# Patient Record
Sex: Female | Born: 1975 | Race: Black or African American | Hispanic: No | Smoking: Never smoker
Health system: Southern US, Community
[De-identification: ages and names within clinical notes are randomized; demographics above are authoritative.]

## PROBLEM LIST (undated history)

## (undated) DIAGNOSIS — G8929 Other chronic pain: Secondary | ICD-10-CM

## (undated) DIAGNOSIS — M797 Fibromyalgia: Secondary | ICD-10-CM

## (undated) DIAGNOSIS — R87619 Unspecified abnormal cytological findings in specimens from cervix uteri: Secondary | ICD-10-CM

## (undated) DIAGNOSIS — D649 Anemia, unspecified: Secondary | ICD-10-CM

## (undated) DIAGNOSIS — R519 Headache, unspecified: Secondary | ICD-10-CM

## (undated) DIAGNOSIS — K649 Unspecified hemorrhoids: Secondary | ICD-10-CM

## (undated) DIAGNOSIS — L989 Disorder of the skin and subcutaneous tissue, unspecified: Secondary | ICD-10-CM

## (undated) DIAGNOSIS — IMO0002 Reserved for concepts with insufficient information to code with codable children: Secondary | ICD-10-CM

## (undated) DIAGNOSIS — I10 Essential (primary) hypertension: Secondary | ICD-10-CM

## (undated) DIAGNOSIS — R51 Headache: Secondary | ICD-10-CM

## (undated) HISTORY — DX: Unspecified hemorrhoids: K64.9

## (undated) HISTORY — PX: LEEP: SHX91

## (undated) HISTORY — PX: ABDOMINAL HYSTERECTOMY: SHX81

## (undated) HISTORY — DX: Unspecified abnormal cytological findings in specimens from cervix uteri: R87.619

## (undated) HISTORY — DX: Anemia, unspecified: D64.9

## (undated) HISTORY — PX: OTHER SURGICAL HISTORY: SHX169

## (undated) HISTORY — DX: Reserved for concepts with insufficient information to code with codable children: IMO0002

---

## 1998-11-15 ENCOUNTER — Emergency Department (HOSPITAL_COMMUNITY): Admission: EM | Admit: 1998-11-15 | Discharge: 1998-11-15 | Payer: Self-pay | Admitting: Emergency Medicine

## 1998-11-15 ENCOUNTER — Encounter: Payer: Self-pay | Admitting: Emergency Medicine

## 1999-01-28 ENCOUNTER — Inpatient Hospital Stay (HOSPITAL_COMMUNITY): Admission: AD | Admit: 1999-01-28 | Discharge: 1999-01-28 | Payer: Self-pay | Admitting: *Deleted

## 1999-01-31 ENCOUNTER — Emergency Department (HOSPITAL_COMMUNITY): Admission: EM | Admit: 1999-01-31 | Discharge: 1999-01-31 | Payer: Self-pay | Admitting: Emergency Medicine

## 1999-03-12 ENCOUNTER — Ambulatory Visit: Admission: RE | Admit: 1999-03-12 | Discharge: 1999-03-12 | Payer: Self-pay | Admitting: Gynecologic Oncology

## 1999-03-12 ENCOUNTER — Encounter (INDEPENDENT_AMBULATORY_CARE_PROVIDER_SITE_OTHER): Payer: Self-pay | Admitting: Specialist

## 1999-04-08 ENCOUNTER — Ambulatory Visit (HOSPITAL_COMMUNITY): Admission: RE | Admit: 1999-04-08 | Discharge: 1999-04-08 | Payer: Self-pay | Admitting: *Deleted

## 1999-04-23 ENCOUNTER — Inpatient Hospital Stay (HOSPITAL_COMMUNITY): Admission: AD | Admit: 1999-04-23 | Discharge: 1999-04-23 | Payer: Self-pay | Admitting: *Deleted

## 1999-05-01 ENCOUNTER — Ambulatory Visit (HOSPITAL_COMMUNITY): Admission: RE | Admit: 1999-05-01 | Discharge: 1999-05-01 | Payer: Self-pay | Admitting: *Deleted

## 1999-05-22 ENCOUNTER — Ambulatory Visit: Admission: RE | Admit: 1999-05-22 | Discharge: 1999-05-22 | Payer: Self-pay | Admitting: Gynecologic Oncology

## 1999-05-22 ENCOUNTER — Other Ambulatory Visit: Admission: RE | Admit: 1999-05-22 | Discharge: 1999-05-22 | Payer: Self-pay | Admitting: Gynecologic Oncology

## 1999-08-06 ENCOUNTER — Ambulatory Visit: Admission: RE | Admit: 1999-08-06 | Discharge: 1999-08-06 | Payer: Self-pay | Admitting: Gynecology

## 1999-08-06 ENCOUNTER — Other Ambulatory Visit: Admission: RE | Admit: 1999-08-06 | Discharge: 1999-08-06 | Payer: Self-pay | Admitting: Gynecology

## 1999-09-13 ENCOUNTER — Inpatient Hospital Stay (HOSPITAL_COMMUNITY): Admission: AD | Admit: 1999-09-13 | Discharge: 1999-09-15 | Payer: Self-pay | Admitting: *Deleted

## 1999-09-13 ENCOUNTER — Encounter (HOSPITAL_COMMUNITY): Admission: RE | Admit: 1999-09-13 | Discharge: 1999-09-16 | Payer: Self-pay | Admitting: *Deleted

## 1999-11-19 ENCOUNTER — Ambulatory Visit (HOSPITAL_COMMUNITY): Admission: RE | Admit: 1999-11-19 | Discharge: 1999-11-19 | Payer: Self-pay | Admitting: *Deleted

## 1999-12-10 ENCOUNTER — Ambulatory Visit: Admission: RE | Admit: 1999-12-10 | Discharge: 1999-12-10 | Payer: Self-pay | Admitting: Gynecologic Oncology

## 2000-10-19 ENCOUNTER — Emergency Department (HOSPITAL_COMMUNITY): Admission: EM | Admit: 2000-10-19 | Discharge: 2000-10-19 | Payer: Self-pay | Admitting: Emergency Medicine

## 2000-11-22 ENCOUNTER — Emergency Department (HOSPITAL_COMMUNITY): Admission: EM | Admit: 2000-11-22 | Discharge: 2000-11-22 | Payer: Self-pay | Admitting: Emergency Medicine

## 2000-12-08 ENCOUNTER — Ambulatory Visit: Admission: RE | Admit: 2000-12-08 | Discharge: 2000-12-08 | Payer: Self-pay | Admitting: Gynecologic Oncology

## 2000-12-08 ENCOUNTER — Other Ambulatory Visit: Admission: RE | Admit: 2000-12-08 | Discharge: 2000-12-08 | Payer: Self-pay | Admitting: Gynecologic Oncology

## 2001-01-05 ENCOUNTER — Encounter (INDEPENDENT_AMBULATORY_CARE_PROVIDER_SITE_OTHER): Payer: Self-pay

## 2001-01-05 ENCOUNTER — Ambulatory Visit (HOSPITAL_COMMUNITY): Admission: RE | Admit: 2001-01-05 | Discharge: 2001-01-05 | Payer: Self-pay | Admitting: Gynecology

## 2001-01-14 ENCOUNTER — Inpatient Hospital Stay (HOSPITAL_COMMUNITY): Admission: AD | Admit: 2001-01-14 | Discharge: 2001-01-14 | Payer: Self-pay | Admitting: *Deleted

## 2001-02-08 ENCOUNTER — Emergency Department (HOSPITAL_COMMUNITY): Admission: EM | Admit: 2001-02-08 | Discharge: 2001-02-08 | Payer: Self-pay | Admitting: Emergency Medicine

## 2001-09-07 ENCOUNTER — Ambulatory Visit: Admission: RE | Admit: 2001-09-07 | Discharge: 2001-09-07 | Payer: Self-pay | Admitting: Gynecology

## 2001-09-07 ENCOUNTER — Other Ambulatory Visit: Admission: RE | Admit: 2001-09-07 | Discharge: 2001-09-07 | Payer: Self-pay | Admitting: Gynecology

## 2002-04-18 ENCOUNTER — Other Ambulatory Visit: Admission: RE | Admit: 2002-04-18 | Discharge: 2002-04-18 | Payer: Self-pay | Admitting: Obstetrics & Gynecology

## 2002-10-12 ENCOUNTER — Emergency Department (HOSPITAL_COMMUNITY): Admission: EM | Admit: 2002-10-12 | Discharge: 2002-10-12 | Payer: Self-pay | Admitting: Emergency Medicine

## 2003-11-16 ENCOUNTER — Other Ambulatory Visit: Admission: RE | Admit: 2003-11-16 | Discharge: 2003-11-16 | Payer: Self-pay | Admitting: Family Medicine

## 2004-02-22 ENCOUNTER — Ambulatory Visit (HOSPITAL_COMMUNITY): Admission: RE | Admit: 2004-02-22 | Discharge: 2004-02-22 | Payer: Self-pay | Admitting: Obstetrics & Gynecology

## 2004-06-25 ENCOUNTER — Emergency Department (HOSPITAL_COMMUNITY): Admission: EM | Admit: 2004-06-25 | Discharge: 2004-06-25 | Payer: Self-pay | Admitting: Family Medicine

## 2005-05-25 ENCOUNTER — Emergency Department (HOSPITAL_COMMUNITY): Admission: EM | Admit: 2005-05-25 | Discharge: 2005-05-26 | Payer: Self-pay | Admitting: Emergency Medicine

## 2005-08-20 ENCOUNTER — Emergency Department (HOSPITAL_COMMUNITY): Admission: EM | Admit: 2005-08-20 | Discharge: 2005-08-20 | Payer: Self-pay | Admitting: Emergency Medicine

## 2007-01-20 ENCOUNTER — Emergency Department (HOSPITAL_COMMUNITY): Admission: EM | Admit: 2007-01-20 | Discharge: 2007-01-20 | Payer: Self-pay | Admitting: Family Medicine

## 2007-02-27 ENCOUNTER — Emergency Department (HOSPITAL_COMMUNITY): Admission: EM | Admit: 2007-02-27 | Discharge: 2007-02-27 | Payer: Self-pay | Admitting: Family Medicine

## 2007-08-26 HISTORY — PX: DIAGNOSTIC LAPAROSCOPY: SUR761

## 2008-01-13 ENCOUNTER — Encounter: Admission: RE | Admit: 2008-01-13 | Discharge: 2008-01-13 | Payer: Self-pay | Admitting: Internal Medicine

## 2008-03-06 ENCOUNTER — Encounter: Admission: RE | Admit: 2008-03-06 | Discharge: 2008-03-06 | Payer: Self-pay | Admitting: Internal Medicine

## 2008-03-16 ENCOUNTER — Emergency Department (HOSPITAL_COMMUNITY): Admission: EM | Admit: 2008-03-16 | Discharge: 2008-03-16 | Payer: Self-pay | Admitting: Family Medicine

## 2008-03-31 ENCOUNTER — Encounter: Payer: Self-pay | Admitting: Obstetrics & Gynecology

## 2008-03-31 ENCOUNTER — Ambulatory Visit (HOSPITAL_COMMUNITY): Admission: RE | Admit: 2008-03-31 | Discharge: 2008-03-31 | Payer: Self-pay | Admitting: Obstetrics & Gynecology

## 2008-07-22 ENCOUNTER — Inpatient Hospital Stay (HOSPITAL_COMMUNITY): Admission: AD | Admit: 2008-07-22 | Discharge: 2008-07-23 | Payer: Self-pay | Admitting: Obstetrics

## 2008-12-07 ENCOUNTER — Emergency Department (HOSPITAL_COMMUNITY): Admission: EM | Admit: 2008-12-07 | Discharge: 2008-12-07 | Payer: Self-pay | Admitting: Emergency Medicine

## 2008-12-09 ENCOUNTER — Emergency Department (HOSPITAL_COMMUNITY): Admission: EM | Admit: 2008-12-09 | Discharge: 2008-12-09 | Payer: Self-pay | Admitting: Emergency Medicine

## 2008-12-15 ENCOUNTER — Encounter: Admission: RE | Admit: 2008-12-15 | Discharge: 2008-12-15 | Payer: Self-pay | Admitting: Internal Medicine

## 2008-12-18 ENCOUNTER — Emergency Department (HOSPITAL_COMMUNITY): Admission: EM | Admit: 2008-12-18 | Discharge: 2008-12-18 | Payer: Self-pay | Admitting: Family Medicine

## 2010-01-28 ENCOUNTER — Encounter: Admission: RE | Admit: 2010-01-28 | Discharge: 2010-01-28 | Payer: Self-pay | Admitting: Internal Medicine

## 2010-06-01 ENCOUNTER — Emergency Department: Payer: Self-pay | Admitting: Emergency Medicine

## 2010-06-03 ENCOUNTER — Emergency Department (HOSPITAL_COMMUNITY): Admission: EM | Admit: 2010-06-03 | Discharge: 2010-06-03 | Payer: Self-pay | Admitting: Family Medicine

## 2010-08-21 ENCOUNTER — Emergency Department (HOSPITAL_COMMUNITY)
Admission: EM | Admit: 2010-08-21 | Discharge: 2010-08-21 | Payer: Self-pay | Source: Home / Self Care | Admitting: Emergency Medicine

## 2010-09-15 ENCOUNTER — Encounter: Payer: Self-pay | Admitting: Internal Medicine

## 2011-01-07 NOTE — Op Note (Signed)
NAMEROMONDA, PARKER              ACCOUNT NO.:  [id number]   MEDICAL RECORD NO.:  [id number]          PATIENT TYPE:  AMB   LOCATION:  SDC                           FACILITY:  [facility]   PHYSICIAN:  Roseanna Rainbow, M.D.DATE OF BIRTH:  [birth year]   DATE OF PROCEDURE:  03/31/2008  DATE OF DISCHARGE:                               OPERATIVE REPORT   PREOPERATIVE DIAGNOSIS:  Right-sided ovarian cyst.   POSTOPERATIVE DIAGNOSES:  1. Right-sided hemorrhagic ovarian cyst.  2. Pelvic adhesions.   SURGEONS:  1. Roseanna Rainbow, MD  2. Charles A. Clearance Coots, MD   ANESTHESIA:  General endotracheal.   COMPLICATIONS:  None.   IV FLUIDS AND URINE OUTPUT:  As per Anesthesiology.   PATHOLOGY:  Right ovarian cyst.   FINDINGS:  There was approximately 4 cm ovarian cyst on the right side.  During the dissection, the cyst was ruptured and a chocolate like  material was noted to be the contents of the cyst.  There were filmy  adhesions involving the right ovary to the parietal peritoneum of the  posterior cul-de-sac.  There were no other stigmata of endometriosis.   PROCEDURE:  The patient was taken to the operating room with an IV  running.  She was given general anesthesia, and placed in the dorsal  lithotomy position, and prepped and draped in the usual sterile fashion.  A bivalve speculum was then placed in the patient's vagina.  The  anterior lip of the cervix was grasped with a single-tooth tenaculum.  A  Hulka manipulator was then advanced into the uterus and secured to the  anterior lip of the cervix as a means to manipulate the uterus.  The  single-tooth tenaculum and speculum were then removed.  After a time-out  had been completed, an infraumbilical skin incision was then made with  the scalpel.  This was carried sharply down to the fascia.  The fascia  was tented up and entered sharply.  The parietal peritoneum was tented  up and entered as well.  The Hasson trocar and sleeve  were then advanced  into the abdomen.  The abdomen was then insufflated.  Bilateral lower  quadrant 5-mm ports were then placed in a direct visualization.  A 10-mm  suprapubic port trocar and sleeve were then advanced under direct  visualization.  A survey of the pelvic anatomy revealed the above  findings.  The above-noted adhesions were divided using EndoShears and  cautery.  The fallopian tube was then grabbed, grasped distally with  blunt traction.  The ovarian cortex was then cauterized and incised with  the EndoShears.  A plane was then developed between the cyst wall and  the ovarian cortex.  The cyst was then enucleated using both sharp and  blunt dissection.  Aqua dissection was also utilized using the Nezhat  irrigator.  During the dissection, the cyst wall was ruptured and a  chocolate type material was noted to be the contents of the cyst.  The  cyst was then placed in the anterior cul-de-sac.  The ovarian cortex was  then inspected for hemostasis.  Bleeding points  were then cauterized  using the Gyrus.  Adequate hemostasis was noted.  Indigo carmine was  given intravenously and there was noted to be no spill of the indigo  carmine in the pelvis.  The pelvis was then irrigated well.  The ovarian  cyst was then placed in an Endocatch bag and then removed from the  abdomen.  The intraabdominal pressure was then dropped to the 7 range  and again adequate hemostasis was noted.  All the instruments and trocar  sleeves were then removed from the abdomen.  The fascial incisions for  the suprapubic port and the infraumbilical port were then closed with 0  Vicryl sutures.  The skin incisions were then closed in subcuticular  fashion using 3-0 Monocryl and Dermabond was then applied.  The Hulka  manipulator was then removed with minimal bleeding noted from the  cervix.  At the close of the procedure, the instrument and pack counts  were said to be correct x2.  The patient was taken to  the PACU awake and  in stable condition.      Roseanna Rainbow, M.D.  Electronically Signed     LAJ/MEDQ  D:  03/31/2008  T:  04/01/2008  Job:  [id number]

## 2011-01-07 NOTE — H&P (Signed)
[name], [name]              ACCOUNT NO.:  [id number]   MEDICAL RECORD NO.:  [id number]          PATIENT TYPE:  AMB   LOCATION:  SDC                           FACILITY:  [facility]   PHYSICIAN:  Roseanna Rainbow, M.D.DATE OF BIRTH:  April 19, 1976   DATE OF ADMISSION:  DATE OF DISCHARGE:                              HISTORY & PHYSICAL   CHIEF COMPLAINT:  The patient is a 35 year old with a right-sided  ovarian cyst who presents for laparoscopic ovarian cystectomy.   HISTORY OF PRESENT ILLNESS:  The patient was recently diagnosed with an  ovarian cyst that has persisted on 2 radiologic studies.  The last study  was performed on March 06, 2008.  There was a complex cystic lesion  approximately 4-6 cm in diameter.  There was marginal interval increase  in the size of this cyst from the previous study.  She has associated  right-sided lower quadrant and pelvic pain.  There is also  menometrorrhagia.   ALLERGIES:  NO KNOWN DRUG ALLERGIES.   MEDICATIONS:  Please see the medication reconciliation form.   SOCIAL HISTORY:  She is single, unemployed, smokes occasionally.  Drinks  a moderate amount of alcoholic beverages.  Denies illicit drug use.   PAST GYNECOLOGICAL HISTORY:  There is a history of cervical dysplasia.  She is status post a LEEP in 2002.   FAMILY HISTORY:  Cervical cancer, myocardial infarction, CVA.   PAST MEDICAL HISTORY:  She denies.   REVIEW OF SYSTEMS:  GU:  Please see the above.   PHYSICAL EXAMINATION:  VITAL SIGNS:  Stable, afebrile.  GENERAL:  Mild distress.  LUNGS: Clear to auscultation bilaterally.  HEART:  Regular rate and rhythm.  ABDOMEN:  There is minimal right lower quadrant tenderness.  No rebound.  No guarding.  PELVIC:  Normal EGBUS.  Speculum exam, the vagina is clean.  The cervix  is without lesions.  Bimanual exam, the right adnexal area is tender.  There is fullness appreciated. The left adnexa is nonpalpable,  nontender.  The uterus is upper  limits of normal size, nontender.   ASSESSMENT:  Right adnexal mass/cystic lesion, symptomatic.   PLAN:  The planned procedure is operative laparoscopy with ovarian  cystectomy.  The risks, benefits, alternative forms of management were  reviewed with the patient and informed consent had been obtained.      Roseanna Rainbow, M.D.  Electronically Signed     LAJ/MEDQ  D:  03/31/2008  T:  03/31/2008  Job:  [id number]

## 2011-01-10 NOTE — Op Note (Signed)
[facility]  Patient:    Monique [name], Monique [name]                MRN: [id number] Proc. Date: 01/05/01 Adm. Date:  [id number] Disc. Date: [id number] Attending:  Antionette Char                           Operative Report  PREOPERATIVE DIAGNOSIS:  Severe dysplasia of the cervix.  POSTOPERATIVE DIAGNOSIS:  Severe dysplasia of the cervix.  OPERATION:  Loop electrosurgical excision procedure (LEEP) excision of the cervix with endocervical curettage.  SURGEON:  Daniel L. Clarke-Pearson, M.D.  ANESTHESIA:  General with orotracheal tube.  SURGICAL FINDINGS:  Examination under anesthesia revealed normal uterus and cervix.  The patient had previously undergone colposcopy and had findings on colposcopy which matched with nonstaining areas on Lugols staining.  DESCRIPTION OF PROCEDURE:  The patient was brought to the operating room, and after satisfactory attainment of general anesthesia, was placed in modified lithotomy position in candy cane stirrups.  The perineum and vagina were prepped, and the patient was draped.  Using a large loop, the exocervical lesion was excised after Lugol solution had been placed in order to outline the entire lesion.  Once this was accomplished, a margin was excised from the exocervix.  An Endocervical "top hat" was excised.  Endocervical curettage was then performed.  The base of the LEEP excision was cauterized with a ball cautery tip, and Munsell solution was applied.  The patient was awakened from anesthesia and taken to the recovery room in satisfactory condition.  Sponge, needle, and instrument counts were correct x 2. DD:  01/26/01 TD:  01/27/01 Job: [id number]

## 2011-01-10 NOTE — Consult Note (Signed)
[facility]  Patient:    Monique [name], Monique [name] Visit Number: [id number] MRN: [id number]          Service Type: GON Location: GYN Attending Physician:  Jeannette Corpus Dictated by:   Rande Brunt. Clarke-Pearson, M.D. Proc. Date: 09/07/01 Admit Date:  09/07/2001 Discharge Date: 09/07/2001   CC:         Leonette Most A. Clearance Coots, M.D.  Telford Nab, R.N.   Consultation Report  A 35 year old African-American female returns after prolonged time of lost to follow-up.  She underwent a LEEP excision of the cervix on Jan 05, 2001 and has failed to keep follow-up appointments.  She presents today for follow-up. The LEEP specimen showed that the patient had severe dysplasia and carcinoma in situ.  The patient reports she has regular cyclic menses.  Has not had any intramenstrual spotting or postcoital bleeding.  She denies any pelvic pain/pressure, GI or GU symptoms.  PHYSICAL EXAMINATION  VITAL SIGNS:  Weight 158 pounds.  ABDOMEN:  Soft, nontender.  No mass, organomegaly, ascites, or hernias are noted.  PELVIC:  EGBUS:  Normal.  Vagina is clean.  Cervix is healed nicely.  No lesions are noted.  Pap smears are obtained.  Bimanual reveals normal uterus. No adnexal masses are noted.  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 Pap smears are pending.  Provided these return normal, we will have the patient return to the care of the [facility] and would suggest she have a repeat Pap smear in approximately three to four months. Dictated by:   Rande Brunt. Clarke-Pearson, M.D. Attending Physician:  Jeannette Corpus DD:  09/15/01 TD:  09/16/01 Job: [id number]

## 2011-01-10 NOTE — Op Note (Signed)
[facility]  Patient:    Monique [name], Monique [name]                        MRN: [id number] Proc. Date: 11/19/99 Adm. Date:  [id number] Attending:  Deniece Ree                           Operative Report  PREOPERATIVE DIAGNOSIS:       Multiparity, desirous of permanent sterilization.  POSTOPERATIVE DIAGNOSIS:  OPERATION:                    Bilateral tubal cauterization with tubal resection.  SURGEON:                      Deniece Ree, M.D.  ASSISTANT:  ANESTHESIA:                   General anesthesia.  ESTIMATED BLOOD LOSS:         Less than 25 cc.  COMPLICATIONS:                None.  CONDITION:                    The patient tolerated the procedure well and returned to the recovery room in satisfactory condition.  DESCRIPTION OF PROCEDURE:     The patient was taken to the operating room and prepped and draped in the usual fashion for laparoscopic surgery.  The speculum was placed in the vagina following which the anterior lip of the cervix was then grasped with a Christella Hartigan tenaculum.  The Acorn uterine manipulating cannula was then put into place.  A subumbilical incision was then made following which the Veress needle was introduced through this incision and approximately 3 liters of carbon dioxide was then infused without any difficulty.  The laparoscopic trocar was placed through this incision following which the laparoscope was placed through its sleeve.  Visualization of the pelvic organs came into view.  The uterus, tubes, and ovaries all appeared to be within normal limits.  The right tube was then grasped 5 mm proximal to the right cornu and cauterized approximately 3 to 4 cm in length. This was done likewise on the left side.  In the cauterized area, the tubes were cut into two locations.  At this point, hemostasis remained present.  Sponge, needle, and instrument counts were correct x 2.  The carbon dioxide was then allowed to  escape from the abdominal cavity which it did so without any problems. The incision was then closed first with a deep interrupted stitch followed by closure of the skin.  The procedure was terminated.  The patient tolerated the procedure well and returned to the recovery room in satisfactory condition. The patient is to be discharged when fully alert.  She has been instructed on the possible complications and care following this type of surgery.  She has been told to return to my office in four weeks for follow-up evaluation or to call me prior to that time should any problems arise. DD:  11/19/99 TD:  11/19/99 Job: [id number]

## 2011-01-10 NOTE — H&P (Signed)
[facility]  Patient:    CARLYLE, ACHENBACH                        MRN: [id number] Adm. Date:  [id number] Attending:  Deniece Ree                         History and Physical  HISTORY:                      Patient is a 35 year old gravida 2, para 2 who is  being admitted for an outpatient bilateral tubal ligation.  Patient has expressed this for some time and all of her questions have been answered.  The possible complications were given to this patient who understands and accepts.  She has o medical problems that would contraindicate this type of surgery.  REVIEW OF HISTORY:            Noncontributory with the exception that patient has had two children.  PHYSICAL EXAMINATION:  GENERAL:                      Revealed a well-developed, well-nourished female n no acute distress.  HEENT:                        Within normal limits.  NECK:                         Supple.  BREASTS:                      Without masses, tenderness, or discharge.  LUNGS:                        Clear to percussion and auscultation.  HEART:                        Normal sinus rhythm without murmur, rub, or gallop.  ABDOMEN:                      Benign.  EXTREMITIES:                  Within normal limits.  NEUROLOGIC:                   Within normal limits.  PELVIC:                       Revealed external genitalia, BUS to be within normal limits.  The vagina is clear.  Cervix is nontender.  Uterus:  Normal size, shape, and consistency.  Adnexa are benign.  DIAGNOSIS:                    Multipara, desires for permanent sterilization.  PLAN:                         Laparoscopic bilateral tubular cauterization with  tubal resection. DD:  11/19/99 TD:  11/19/99 Job: [id number]

## 2011-01-10 NOTE — Consult Note (Signed)
[facility]  Patient:    Monique [name], Monique [name]                        MRN: [id number] Adm. Date:  [id number] Attending:  Ronita Hipps T CC:         Telford Nab, R.N.  Leane Call, R.N.  Bing Neighbors. Clearance Coots, M.D.   Consultation Report  Ms. 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  She was last seen in April 2001 and at that time had colposcopy with findings suggesting moderate dysplasia, but refused a biopsy and did not show up for a scheduled LEEP biopsy of the cervix.  Apparently, she has been fearful of discomfort related to the biopsy.  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  She had vaginal delivery on January 2001 and underwent postpartum tubal ligation.  At postpartum followup she had colposcopy suggesting persistent dysplasia, but did not show for a scheduled LEEP biopsy of the cervix.  She denies pelvic pain or postcoital/intramenstrual bleeding.  PAST MEDICAL HISTORY:  Prior sexually transmitted diseases with Trichomonas and PID.  No other major comorbidities.  PAST SURGICAL HISTORY:  Negative other than BTL and NSVD x 2.  MEDICATIONS:  None.  ALLERGIES:  No known drug allergies.  SOCIAL HISTORY:  Denies tobacco.  Intermittent social ethanol.  FAMILY HISTORY:  Significant for mother dying of surgical cancer following radio therapy.  REVIEW OF SYSTEMS:  Otherwise negative.  PHYSICAL EXAMINATION  VITAL SIGNS:  Stable and afebrile.  Weight 150 pounds.  GENERAL:  Patient is alert and oriented x 3 in no acute distress.  LUNGS:  Fields are clear.  BACK:  No back or CVA tenderness.  ABDOMEN:  Soft and benign without mass, ascites, or organomegaly.  EXTREMITIES:  No edema and full range of motion.  PELVIC:  External  genitalia and vault are clear without lesions.  The cervix has no visible lesions and is nontender to motion.  Bimanual examination reveals small anteflexed uterus with no palpable adnexal structures and normal cervical consistency.  There is no parametrial nodularity.  PROCEDURE:  Colposcopy of the upper vagina and cervix performed.  From 11-1 oclock there is a patch of white epithelium with punctation and a suggestion of possible mosaicism.  This extends into the canal.  The patient refused biopsy or ECC despite multiple attempts.  ASSESSMENT:  Prior high grade cervical dysplasia, untreated.  Colposcopy suggests persistent dysplasia.  Pap smear is obtained and will be communicated to the patient.  We had a lengthy discussion with the patient detailing our concerns that she might develop a potentially fatal, invasive malignancy of the cervix.  In no uncertain terms we recommended evaluation with biopsies and since she refused this, recommended performance of LEEP biopsy of the cervix as an outpatient.  This will be tentatively scheduled but if the patient will not follow through with this recommendation she will be sent a registered letter discharging her from our clinic. DD:  12/08/00 TD:  12/09/00 Job: [id number]

## 2011-02-13 ENCOUNTER — Inpatient Hospital Stay (HOSPITAL_COMMUNITY)
Admission: AD | Admit: 2011-02-13 | Discharge: 2011-02-13 | Disposition: A | Discharge status: Home / Self Care | Payer: Medicaid Other | Source: Ambulatory Visit | Attending: Obstetrics & Gynecology | Admitting: Obstetrics & Gynecology

## 2011-02-13 DIAGNOSIS — N644 Mastodynia: Secondary | ICD-10-CM | POA: Insufficient documentation

## 2011-02-13 DIAGNOSIS — G43909 Migraine, unspecified, not intractable, without status migrainosus: Secondary | ICD-10-CM | POA: Insufficient documentation

## 2011-02-13 DIAGNOSIS — I10 Essential (primary) hypertension: Secondary | ICD-10-CM

## 2011-02-13 LAB — URINALYSIS, ROUTINE W REFLEX MICROSCOPIC
Bilirubin Urine: NEGATIVE
Ketones, ur: NEGATIVE mg/dL
Leukocytes, UA: NEGATIVE
Specific Gravity, Urine: 1.02 (ref 1.005–1.030)
pH: 6.5 (ref 5.0–8.0)

## 2011-02-13 LAB — POCT PREGNANCY, URINE: Preg Test, Ur: NEGATIVE

## 2011-03-01 ENCOUNTER — Encounter (HOSPITAL_COMMUNITY): Payer: Self-pay | Admitting: Obstetrics and Gynecology

## 2011-03-01 ENCOUNTER — Inpatient Hospital Stay (HOSPITAL_COMMUNITY): Payer: Medicaid Other | Admitting: Family Medicine

## 2011-03-01 ENCOUNTER — Inpatient Hospital Stay (HOSPITAL_COMMUNITY)
Admission: AD | Admit: 2011-03-01 | Discharge: 2011-03-01 | Disposition: A | Discharge status: Home / Self Care | Payer: Medicaid Other | Source: Ambulatory Visit | Attending: Family Medicine | Admitting: Family Medicine

## 2011-03-01 DIAGNOSIS — N949 Unspecified condition associated with female genital organs and menstrual cycle: Secondary | ICD-10-CM | POA: Insufficient documentation

## 2011-03-01 HISTORY — DX: Disorder of the skin and subcutaneous tissue, unspecified: L98.9

## 2011-03-01 HISTORY — DX: Headache: R51

## 2011-03-01 HISTORY — DX: Other chronic pain: G89.29

## 2011-03-01 HISTORY — DX: Headache, unspecified: R51.9

## 2011-03-01 LAB — URINALYSIS, ROUTINE W REFLEX MICROSCOPIC
Bilirubin Urine: NEGATIVE
Specific Gravity, Urine: >1.03 — ABNORMAL HIGH (ref 1.005–1.030)
pH: 6 (ref 5.0–8.0)

## 2011-03-01 LAB — URINE MICROSCOPIC-ADD ON

## 2011-03-01 LAB — POCT PREGNANCY, URINE: Preg Test, Ur: NEGATIVE

## 2011-03-01 NOTE — Progress Notes (Signed)
"  Yellow d/c now that has a nasty odor like at the end of your cycle kind of smell.  It started 2 days ago, but it wasn't yellow.  It was just heavier.  LMP was 02/18/11 lasted 2 days, it's normally 4-5 days."

## 2011-03-01 NOTE — Progress Notes (Signed)
Yellow vaginal discharge since Wednesday night with foul odor, LMP 02/23/2011

## 2011-03-06 ENCOUNTER — Ambulatory Visit: Payer: Medicaid Other | Admitting: Obstetrics and Gynecology

## 2011-04-11 ENCOUNTER — Inpatient Hospital Stay (HOSPITAL_COMMUNITY)
Admission: AD | Admit: 2011-04-11 | Discharge: 2011-04-11 | Disposition: A | Discharge status: Home / Self Care | Payer: Medicaid Other | Source: Ambulatory Visit | Attending: Obstetrics & Gynecology | Admitting: Obstetrics & Gynecology

## 2011-04-11 ENCOUNTER — Encounter (HOSPITAL_COMMUNITY): Payer: Self-pay

## 2011-04-11 DIAGNOSIS — B3731 Acute candidiasis of vulva and vagina: Secondary | ICD-10-CM | POA: Insufficient documentation

## 2011-04-11 DIAGNOSIS — B373 Candidiasis of vulva and vagina: Secondary | ICD-10-CM | POA: Insufficient documentation

## 2011-04-11 DIAGNOSIS — N949 Unspecified condition associated with female genital organs and menstrual cycle: Secondary | ICD-10-CM | POA: Insufficient documentation

## 2011-04-11 HISTORY — DX: Essential (primary) hypertension: I10

## 2011-04-11 LAB — WET PREP, GENITAL: Trich, Wet Prep: NONE SEEN

## 2011-04-11 MED ORDER — FLUCONAZOLE 150 MG PO TABS: 150.0000 mg | ORAL_TABLET | Freq: Once | ORAL | Status: AC

## 2011-04-11 NOTE — Progress Notes (Signed)
Pt states she has had a yellow vaginal discharge for 2 days that is itching.

## 2011-04-11 NOTE — ED Provider Notes (Addendum)
History   Pt presents today c/o vag dc and irritation for the past 2 days. She states she had trich several months ago and recently had intercourse with the same man. She denies abd pain, fever, vag bleeding, or any other sx at this time.  Chief Complaint  Patient presents with  . Vaginal Discharge   HPI  OB History    Grav Para Term Preterm Abortions TAB SAB Ect Mult Living   2 2 2  0 0 0 0 0 0 2      Past Medical History  Diagnosis Date  . Chronic headaches   . Skin abnormalities     unnknown skin dx, extremely dry skin  . Alopecia   . Hypertension     Past Surgical History  Procedure Date  . Leep 2001 or 2002  . Unknown     "some surgery to remove fibroid cyst that started with an 'E' "    Family History  Problem Relation Age of Onset  . Cancer Mother   . Cancer Brother   . Diabetes Mother   . Diabetes Father   . Diabetes Sister   . Diabetes Brother     History  Substance Use Topics  . Smoking status: Never Smoker   . Smokeless tobacco: Not on file  . Alcohol Use: 0.5 oz/week    1 drink(s) per week     socially    Allergies: No Known Allergies  Prescriptions prior to admission  Medication Sig Dispense Refill  . Eszopiclone (LUNESTA PO) Take by mouth at bedtime as needed.        . Topiramate (TOPAMAX PO) Take by mouth every other day. For migraine prevention       . TRIAMCINOLONE ACETONIDE EX Apply topically 2 (two) times daily.        Marland Kitchen VERAPAMIL HCL PO Take by mouth daily.          Review of Systems  Constitutional: Negative for fever and chills.  Cardiovascular: Negative for chest pain.  Gastrointestinal: Negative for nausea, vomiting, abdominal pain and diarrhea.  Genitourinary: Negative for dysuria, urgency, frequency and hematuria.  Neurological: Negative for dizziness and headaches.  Psychiatric/Behavioral: Negative for depression and suicidal ideas.   Physical Exam   Blood pressure 130/90, pulse 80, temperature 98.5 F (36.9 C),  temperature source Oral, resp. rate 16, height 5\' 5"  (1.651 m), weight 188 lb 12.8 oz (85.639 kg), last menstrual period 03/18/2011, SpO2 98.00%.  Physical Exam  Constitutional: She is oriented to person, place, and time. She appears well-developed and well-nourished. No distress.  HENT:  Head: Normocephalic and atraumatic.  Eyes: EOM are normal. Pupils are equal, round, and reactive to light.  GI: Soft. She exhibits no distension. There is no tenderness. There is no rebound and no guarding.  Genitourinary: No bleeding around the vagina. Vaginal discharge found.       Green, frothy vag dc present.  Neurological: She is alert and oriented to person, place, and time.  Skin: Skin is warm and dry. She is not diaphoretic.  Psychiatric: She has a normal mood and affect. Her behavior is normal. Judgment and thought content normal.    MAU Course  Procedures  Wet prep and GC/Chlamydia cultures obtained.  Results for orders placed during the hospital encounter of 04/11/11 (from the past 24 hour(s))  WET PREP, GENITAL     Status: Abnormal   Collection Time   04/11/11  7:56 AM      Component Value Range  Yeast, Wet Prep FEW (*) NONE SEEN    Trich, Wet Prep NONE SEEN  NONE SEEN    Clue Cells, Wet Prep NONE SEEN  NONE SEEN    WBC, Wet Prep HPF POC TOO NUMEROUS TO COUNT (*) NONE SEEN     Assessment and Plan  Yeast: discussed with pt at length. Will give Rx for diflucan. Discussed diet, activity, risks, and precautions. Discussed safe sex practices.  Clinton Gallant. Marlean Mortell III, DrHSc, MPAS, PA-C  04/11/2011, 8:01 AM

## 2011-05-23 LAB — CBC
MCHC: 34
MCV: 91.5
RBC: 3.94

## 2011-05-28 ENCOUNTER — Encounter: Payer: Medicaid Other | Admitting: Obstetrics & Gynecology

## 2011-05-28 ENCOUNTER — Ambulatory Visit (INDEPENDENT_AMBULATORY_CARE_PROVIDER_SITE_OTHER): Payer: Medicaid Other | Admitting: Obstetrics and Gynecology

## 2011-05-28 ENCOUNTER — Other Ambulatory Visit (HOSPITAL_COMMUNITY)
Admission: RE | Admit: 2011-05-28 | Discharge: 2011-05-28 | Disposition: A | Discharge status: Home / Self Care | Payer: Medicaid Other | Source: Ambulatory Visit | Attending: Obstetrics and Gynecology | Admitting: Obstetrics and Gynecology

## 2011-05-28 ENCOUNTER — Encounter: Payer: Self-pay | Admitting: Obstetrics and Gynecology

## 2011-05-28 VITALS — BP 130/88 | HR 101 | Temp 97.4°F | Ht 64.0 in | Wt 185.3 lb

## 2011-05-28 DIAGNOSIS — Z01419 Encounter for gynecological examination (general) (routine) without abnormal findings: Secondary | ICD-10-CM

## 2011-05-28 DIAGNOSIS — Z23 Encounter for immunization: Secondary | ICD-10-CM

## 2011-05-28 LAB — POCT PREGNANCY, URINE: Preg Test, Ur: NEGATIVE

## 2011-05-28 LAB — CBC
HCT: 33.8 — ABNORMAL LOW
MCV: 89.9
Platelets: 170
RDW: 13.7

## 2011-05-28 MED ORDER — INFLUENZA VIRUS VACC SPLIT PF IM SUSP
0.5000 mL | Freq: Once | INTRAMUSCULAR | Status: AC
  Administered 2011-05-28: 0.5 mL via INTRAMUSCULAR

## 2011-05-28 NOTE — Progress Notes (Signed)
This patient is a 35 year old African American female gravida 2 para 2002. She went underwent a LEEP conization for dysplasia of the cervix in 2002. Her Pap smears have been annual and all normal since that time. She is in today for routine well woman GYN exam. She has an internist of takes care of medical problems.  Significant family history is her mother had cervical cancer and her father heart disease.  She's currently on prophylactic medication for migraine was seems to been quite successful. She's now being worked up for fibromyalgia.  Examination: Thyroid symmetrical no dominant masses. Breasts: Symmetrical without dominant masses without nipple discharge no supraclavicular nor axillary nodes noted. Abdomen: Soft flat nontender no masses no organomegaly. Genitalia: External normal, introitus marital, BUS within normal limits, vagina clean and well rugated, cervix clean and parous. Bimanual exam: Uterus anterior normal size shape consistency adnexa could not be well outlined because of the habitus of the patient.  Impression normal gynecological examination. Pap smear was taken.

## 2011-06-22 ENCOUNTER — Inpatient Hospital Stay (HOSPITAL_COMMUNITY)
Admission: AD | Admit: 2011-06-22 | Discharge: 2011-06-22 | Disposition: A | Discharge status: Home / Self Care | Payer: Medicaid Other | Source: Ambulatory Visit | Attending: Obstetrics & Gynecology | Admitting: Obstetrics & Gynecology

## 2011-06-22 ENCOUNTER — Encounter (HOSPITAL_COMMUNITY): Payer: Self-pay

## 2011-06-22 DIAGNOSIS — B373 Candidiasis of vulva and vagina: Secondary | ICD-10-CM

## 2011-06-22 DIAGNOSIS — N390 Urinary tract infection, site not specified: Secondary | ICD-10-CM | POA: Insufficient documentation

## 2011-06-22 LAB — URINE MICROSCOPIC-ADD ON

## 2011-06-22 LAB — WET PREP, GENITAL

## 2011-06-22 LAB — URINALYSIS, ROUTINE W REFLEX MICROSCOPIC
Glucose, UA: NEGATIVE mg/dL
pH: 5.5 (ref 5.0–8.0)

## 2011-06-22 LAB — POCT PREGNANCY, URINE: Preg Test, Ur: NEGATIVE

## 2011-06-22 MED ORDER — NYSTATIN-TRIAMCINOLONE 100000-0.1 UNIT/GM-% EX CREA: TOPICAL_CREAM | Freq: Four times a day (QID) | CUTANEOUS | Status: DC

## 2011-06-22 MED ORDER — CIPROFLOXACIN HCL 500 MG PO TABS: 500.0000 mg | ORAL_TABLET | Freq: Two times a day (BID) | ORAL | Status: AC

## 2011-06-22 MED ORDER — FLUCONAZOLE 200 MG PO TABS: 200.0000 mg | ORAL_TABLET | Freq: Every day | ORAL | Status: AC

## 2011-06-22 NOTE — ED Provider Notes (Signed)
Agree with above note.  Monique [name] H. 06/22/2011 12:35 PM

## 2011-06-22 NOTE — ED Provider Notes (Signed)
History   Monique [name] is a 35 y.o. year old [id number] female who presents to [facility] reporting frequency of urination x 2 weeks and increased vaginal discharge and irritation x 3 days. Last Pap Oct 2012 normal per pt.  CSN: [id number] Arrival date & time: 06/22/2011  8:52 AM   None     Chief Complaint  Patient presents with  . Vaginal Discharge  . Urinary Frequency    (Consider location/radiation/quality/duration/timing/severity/associated sxs/prior treatment) HPI  Past Medical History  Diagnosis Date  . Chronic headaches   . Skin abnormalities     unnknown skin dx, extremely dry skin  . Alopecia   . Hypertension     Past Surgical History  Procedure Date  . Leep 2001 or 2002  . Unknown     "some surgery to remove fibroid cyst that started with an 'E' "  . Leep     Family History  Problem Relation Age of Onset  . Cancer Mother   . Cancer Brother   . Diabetes Mother   . Diabetes Father   . Diabetes Sister   . Diabetes Brother     History  Substance Use Topics  . Smoking status: Never Smoker   . Smokeless tobacco: Not on file  . Alcohol Use: 0.5 oz/week    1 drink(s) per week     socially    OB History    Grav Para Term Preterm Abortions TAB SAB Ect Mult Living   2 2 2  0 0 0 0 0 0 2      Review of Systems  Constitutional: Negative for chills and fatigue.  Genitourinary: Positive for urgency, frequency and vaginal pain (vuvar irritation). Negative for dysuria, hematuria, flank pain, vaginal bleeding, difficulty urinating, genital sores, menstrual problem, pelvic pain and dyspareunia.    Allergies  Review of patient's allergies indicates no known allergies.  Home Medications  No current outpatient prescriptions on file.  BP 132/88  Pulse 90  Temp(Src) 98.9 F (37.2 C) (Oral)  Resp 16  Ht 5' 4.5" (1.638 m)  Wt 85.367 kg (188 lb 3.2 oz)  BMI 31.81 kg/m2  LMP 06/10/2011  Physical Exam  Constitutional: She is oriented to person, place, and  time. She appears well-developed and well-nourished. No distress.  Cardiovascular: Normal rate.   Pulmonary/Chest: Effort normal.  Abdominal: Soft. There is no tenderness. There is no CVA tenderness.  Genitourinary: There is tenderness on the right labia. There is no rash or lesion on the right labia. There is tenderness on the left labia. There is no rash or lesion on the left labia. Uterus is not enlarged and not tender. Cervix exhibits friability. Cervix exhibits no motion tenderness and no discharge. Right adnexum displays no mass, no tenderness and no fullness. Left adnexum displays no mass, no tenderness and no fullness. There is tenderness around the vagina. No erythema or bleeding around the vagina. Vaginal discharge (mod amount of thin, white, malodorous discharge) found.  Neurological: She is alert and oriented to person, place, and time.  Skin: Skin is warm and dry.  Psychiatric: She has a normal mood and affect.    ED Course  Procedures (including critical care time)  Results for orders placed during the hospital encounter of 06/22/11 (from the past 24 hour(s))  POCT PREGNANCY, URINE     Status: Normal   Collection Time   06/22/11  9:09 AM      Component Value Range   Preg Test, Ur NEGATIVE    URINALYSIS,  ROUTINE W REFLEX MICROSCOPIC     Status: Abnormal   Collection Time   06/22/11  9:10 AM      Component Value Range   Color, Urine YELLOW  YELLOW    Appearance HAZY (*) CLEAR    Specific Gravity, Urine >1.030 (*) 1.005 - 1.030    pH 5.5  5.0 - 8.0    Glucose, UA NEGATIVE  NEGATIVE (mg/dL)   Hgb urine dipstick TRACE (*) NEGATIVE    Bilirubin Urine NEGATIVE  NEGATIVE    Ketones, ur NEGATIVE  NEGATIVE (mg/dL)   Protein, ur NEGATIVE  NEGATIVE (mg/dL)   Urobilinogen, UA 0.2  0.0 - 1.0 (mg/dL)   Nitrite NEGATIVE  NEGATIVE    Leukocytes, UA MODERATE (*) NEGATIVE   URINE MICROSCOPIC-ADD ON     Status: Abnormal   Collection Time   06/22/11  9:10 AM      Component Value Range     Squamous Epithelial / LPF FEW (*) RARE    WBC, UA 3-6  <3 (WBC/hpf)   RBC / HPF 7-10  <3 (RBC/hpf)   Bacteria, UA MANY (*) RARE   WET PREP, GENITAL     Status: Abnormal   Collection Time   06/22/11  9:20 AM      Component Value Range   Yeast, Wet Prep FEW (*) NONE SEEN    Trich, Wet Prep NONE SEEN  NONE SEEN    Clue Cells, Wet Prep NONE SEEN  NONE SEEN    WBC, Wet Prep HPF POC TOO NUMEROUS TO COUNT (*) NONE SEEN     MDM  Assessment: 1. UTI 2. VVC  Plan: 1. D/C home 2. Rx Cipro x 3 days and mycolog II  Millicent Blazejewski 06/22/2011 10:47 AM

## 2011-06-22 NOTE — Progress Notes (Signed)
Onset of vaginal discharge since yesterday, urinary frequency and cloudy, LMP 10/16

## 2011-12-30 ENCOUNTER — Other Ambulatory Visit: Payer: Self-pay | Admitting: Dermatology

## 2012-06-02 ENCOUNTER — Encounter: Payer: Self-pay | Admitting: Obstetrics and Gynecology

## 2012-06-02 ENCOUNTER — Other Ambulatory Visit (HOSPITAL_COMMUNITY)
Admission: RE | Admit: 2012-06-02 | Discharge: 2012-06-02 | Disposition: A | Discharge status: Home / Self Care | Payer: Medicaid Other | Source: Ambulatory Visit | Attending: Obstetrics and Gynecology | Admitting: Obstetrics and Gynecology

## 2012-06-02 ENCOUNTER — Ambulatory Visit (INDEPENDENT_AMBULATORY_CARE_PROVIDER_SITE_OTHER): Payer: Medicaid Other | Admitting: Obstetrics and Gynecology

## 2012-06-02 VITALS — BP 129/86 | HR 92 | Temp 98.8°F | Resp 16 | Ht 64.5 in | Wt 204.1 lb

## 2012-06-02 DIAGNOSIS — Z01419 Encounter for gynecological examination (general) (routine) without abnormal findings: Secondary | ICD-10-CM

## 2012-06-02 DIAGNOSIS — R350 Frequency of micturition: Secondary | ICD-10-CM

## 2012-06-02 DIAGNOSIS — N938 Other specified abnormal uterine and vaginal bleeding: Secondary | ICD-10-CM | POA: Insufficient documentation

## 2012-06-02 DIAGNOSIS — N949 Unspecified condition associated with female genital organs and menstrual cycle: Secondary | ICD-10-CM

## 2012-06-02 DIAGNOSIS — Z23 Encounter for immunization: Secondary | ICD-10-CM

## 2012-06-02 DIAGNOSIS — Z113 Encounter for screening for infections with a predominantly sexual mode of transmission: Secondary | ICD-10-CM | POA: Insufficient documentation

## 2012-06-02 DIAGNOSIS — Z1151 Encounter for screening for human papillomavirus (HPV): Secondary | ICD-10-CM | POA: Insufficient documentation

## 2012-06-02 LAB — POCT URINALYSIS DIP (DEVICE)
Glucose, UA: NEGATIVE mg/dL
Ketones, ur: NEGATIVE mg/dL
Specific Gravity, Urine: 1.02 (ref 1.005–1.030)
Urobilinogen, UA: 2 mg/dL — ABNORMAL HIGH (ref 0.0–1.0)

## 2012-06-02 MED ORDER — INFLUENZA VIRUS VACC SPLIT PF IM SUSP
0.5000 mL | Freq: Once | INTRAMUSCULAR | Status: AC
  Administered 2012-06-02: 0.5 mL via INTRAMUSCULAR

## 2012-06-02 NOTE — Progress Notes (Signed)
Patient ID: Monique [name], female   DOB: March 28, 1976, 36 y.o.   MRN: [id number] Subjective:     Monique [name] is a 36 y.o. female here for a routine exam.  Current complaints:irregular menstrual bleeding.   Patient here for routine PAP.  Irregular bleeding- Patient also has complaint of irregular menstrual bleeding for about 3 months. She reports having 2 period per month that are about 1 week apart. The first period lasts 5 days with moderate bleeding. The second period lasts 3 days with heavier bleeding. Prior to this she had regular periods once per month that lasted 3-5 days with moderate bleeding. Patient reports having history of a fibroid that was removed in 2009. She reports that she has chronic anemia for which she take iron. She says that she is now having increased lightheadedness and fatigue. She does not currently take and OCPs or HRTs.   Urinary frequency- Patient reports having urinary frequency.    Gynecologic History Patient's last menstrual period was 05/23/2012. Contraception: none Last Pap: 05/2011. Results were: normal   Obstetric History OB History    Grav Para Term Preterm Abortions TAB SAB Ect Mult Living   2 2 2  0 0 0 0 0 0 2     # Outc Date GA Lbr Len/2nd Wgt Sex Del Anes PTL Lv   1 TRM 5/98           2 TRM 1/01                 Review of Systems Pertinent items are noted in HPI.    Objective:   Physical Exam:  General: well developed, well nourished, no apparent distress Cardio: normal rate, no murmurs/gallops/rubs Pulm: normal rate, no respiratory distress, no wheezes/rales/rhonchi Abdomen: soft, non-tender, no masses/organomegaly GU: Vagina- normal, no discharge   Cervix- normal, no discharge, no cervical motion tenderness  Uterus- solid mass felt at anterior fornix, uterus normal size  Adnexa- no tenderness or fullness Breast: normal/symmetrical bilateral.  no masses or nipple discharge/inversion   Results for orders placed in visit on  06/02/12 (from the past 24 hour(s))  POCT URINALYSIS DIP (DEVICE)     Status: Abnormal   Collection Time   06/02/12  1:54 PM      Component Value Range   Glucose, UA NEGATIVE  NEGATIVE mg/dL   Bilirubin Urine NEGATIVE  NEGATIVE   Ketones, ur NEGATIVE  NEGATIVE mg/dL   Specific Gravity, Urine 1.020  1.005 - 1.030   Hgb urine dipstick NEGATIVE  NEGATIVE   pH 7.0  5.0 - 8.0   Protein, ur NEGATIVE  NEGATIVE mg/dL   Urobilinogen, UA 2.0 (*) 0.0 - 1.0 mg/dL   Nitrite NEGATIVE  NEGATIVE   Leukocytes, UA NEGATIVE  NEGATIVE       Assessment/Plan:      DUB- Patient having irregular uterine bleeding for about 3 months. She has history of fibroids. Solid mass felt on anterior uterus. We will schedule her for Pelvic ultrasound for further assess for fibroids and stripe thickness.  Pap done today. We will follow-up with these results as needed.  GC/Chlamydia culture done with PAP.  Patient to follow up in 6 weeks.    Evaluation and management procedures were performed by Winfield Cunas, PA-S under my supervision/collaboration. Chart reviewed, patient examined by me and I agree with management and plan. Danae Orleans, CNM 06/03/2012 12:04 PM

## 2012-06-02 NOTE — Progress Notes (Signed)
Pt states she has been having 2 periods per month x2 months. Sometimes she has strong cramping and breast tenderness.  Pt also reports urinary frequency and oliguria. CCUA obtained. Pt desires flu shot today.

## 2012-06-02 NOTE — Patient Instructions (Addendum)
Dysfunctional Uterine Bleeding  Normally, menstrual periods begin between ages 11 to 17 in young women. A normal menstrual cycle/period may begin every 23 days up to 35 days and lasts from 1 to 7 days. Around 12 to 14 days before your menstrual period starts, ovulation (ovary produces an egg) occurs. When counting the time between menstrual periods, count from the first day of bleeding of the previous period to the first day of bleeding of the next period.  Dysfunctional (abnormal) uterine bleeding is bleeding that is different from a normal menstrual period. Your periods may come earlier or later than usual. They may be lighter, have blood clots or be heavier. You may have bleeding between periods, or you may skip one period or more. You may have bleeding after sexual intercourse, bleeding after menopause, or no menstrual period.  CAUSES   · Pregnancy (normal, miscarriage, tubal).  · IUDs (intrauterine device, birth control).  · Birth control pills.  · Hormone treatment.  · Menopause.  · Infection of the cervix.  · Blood clotting problems.  · Infection of the inside lining of the uterus.  · Endometriosis, inside lining of the uterus growing in the pelvis and other female organs.  · Adhesions (scar tissue) inside the uterus.  · Obesity or severe weight loss.  · Uterine polyps inside the uterus.  · Cancer of the vagina, cervix, or uterus.  · Ovarian cysts or polycystic ovary syndrome.  · Medical problems (diabetes, thyroid disease).  · Uterine fibroids (noncancerous tumor).  · Problems with your female hormones.  · Endometrial hyperplasia, very thick lining and enlarged cells inside of the uterus.  · Medicines that interfere with ovulation.  · Radiation to the pelvis or abdomen.  · Chemotherapy.  DIAGNOSIS   · Your doctor will discuss the history of your menstrual periods, medicines you are taking, changes in your weight, stress in your life, and any medical problems you may have.  · Your doctor will do a physical  and pelvic examination.  · Your doctor may want to perform certain tests to make a diagnosis, such as:  · Pap test.  · Blood tests.  · Cultures for infection.  · CT scan.  · Ultrasound.  · Hysteroscopy.  · Laparoscopy.  · MRI.  · Hysterosalpingography.  · D and C.  · Endometrial biopsy.  TREATMENT   Treatment will depend on the cause of the dysfunctional uterine bleeding (DUB). Treatment may include:  · Observing your menstrual periods for a couple of months.  · Prescribing medicines for medical problems, including:  · Antibiotics.  · Hormones.  · Birth control pills.  · Removing an IUD (intrauterine device, birth control).  · Surgery:  · D and C (scrape and remove tissue from inside the uterus).  · Laparoscopy (examine inside the abdomen with a lighted tube).  · Uterine ablation (destroy lining of the uterus with electrical current, laser, heat, or freezing).  · Hysteroscopy (examine cervix and uterus with a lighted tube).  · Hysterectomy (remove the uterus).  HOME CARE INSTRUCTIONS   · If medicines were prescribed, take exactly as directed. Do not change or switch medicines without consulting your caregiver.  · Long term heavy bleeding may result in iron deficiency. Your caregiver may have prescribed iron pills. They help replace the iron that your body lost from heavy bleeding. Take exactly as directed.  · Do not take aspirin or medicines that contain aspirin one week before or during your menstrual period. Aspirin may make   the bleeding worse.  · If you need to change your sanitary pad or tampon more than once every 2 hours, stay in bed with your feet elevated and a cold pack on your lower abdomen. Rest as much as possible, until the bleeding stops or slows down.  · Eat well-balanced meals. Eat foods high in iron. Examples are:  · Leafy green vegetables.  · Whole-grain breads and cereals.  · Eggs.  · Meat.  · Liver.  · Do not try to lose weight until the abnormal bleeding has stopped and your blood iron level is  back to normal. Do not lift more than ten pounds or do strenuous activities when you are bleeding.  · For a couple of months, make note on your calendar, marking the start and ending of your period, and the type of bleeding (light, medium, heavy, spotting, clots or missed periods). This is for your caregiver to better evaluate your problem.  SEEK MEDICAL CARE IF:   · You develop nausea (feeling sick to your stomach) and vomiting, dizziness, or diarrhea while you are taking your medicine.  · You are getting lightheaded or weak.  · You have any problems that may be related to the medicine you are taking.  · You develop pain with your DUB.  · You want to remove your IUD.  · You want to stop or change your birth control pills or hormones.  · You have any type of abnormal bleeding mentioned above.  · You are over 16 years old and have not had a menstrual period yet.  · You are 36 years old and you are still having menstrual periods.  · You have any of the symptoms mentioned above.  · You develop a rash.  SEEK IMMEDIATE MEDICAL CARE IF:   · An oral temperature above 102° F (38.9° C) develops.  · You develop chills.  · You are changing your sanitary pad or tampon more than once an hour.  · You develop abdominal pain.  · You pass out or faint.  Document Released: 08/08/2000 Document Revised: 11/03/2011 Document Reviewed: 07/10/2009  [employer]® Patient Information ©2013 [employer].

## 2012-06-03 LAB — CBC
HCT: 35.2 % — ABNORMAL LOW (ref 36.0–46.0)
MCV: 86.7 fL (ref 78.0–100.0)
Platelets: 219 10*3/uL (ref 150–400)
RBC: 4.06 MIL/uL (ref 3.87–5.11)
WBC: 7.1 10*3/uL (ref 4.0–10.5)

## 2012-06-10 ENCOUNTER — Ambulatory Visit (HOSPITAL_COMMUNITY)
Admission: RE | Admit: 2012-06-10 | Discharge: 2012-06-10 | Disposition: A | Discharge status: Home / Self Care | Payer: Medicaid Other | Source: Ambulatory Visit | Attending: Obstetrics and Gynecology | Admitting: Obstetrics and Gynecology

## 2012-06-10 DIAGNOSIS — R350 Frequency of micturition: Secondary | ICD-10-CM

## 2012-06-10 DIAGNOSIS — D259 Leiomyoma of uterus, unspecified: Secondary | ICD-10-CM | POA: Insufficient documentation

## 2012-06-10 DIAGNOSIS — Z23 Encounter for immunization: Secondary | ICD-10-CM

## 2012-06-10 DIAGNOSIS — N920 Excessive and frequent menstruation with regular cycle: Secondary | ICD-10-CM | POA: Insufficient documentation

## 2012-06-10 DIAGNOSIS — N949 Unspecified condition associated with female genital organs and menstrual cycle: Secondary | ICD-10-CM | POA: Insufficient documentation

## 2012-06-10 DIAGNOSIS — N938 Other specified abnormal uterine and vaginal bleeding: Secondary | ICD-10-CM

## 2012-06-13 ENCOUNTER — Encounter: Payer: Self-pay | Admitting: Obstetrics and Gynecology

## 2012-06-14 ENCOUNTER — Telehealth: Payer: Self-pay | Admitting: *Deleted

## 2012-06-14 NOTE — Telephone Encounter (Signed)
error 

## 2012-06-14 NOTE — Telephone Encounter (Signed)
Patient called back in response to a MyChart email question she sent in. She stated that she is bleeding very heavy and having to change her pad very often. I advised that if she is changing it more frequently than every 60 min and it is saturated she should go to [facility]. I told her she can use ibuprofen for pain. An appt was made for her to followup in clinic on Wednesday to discuss her bleeding, pain and ultrasound results. Pt agreeable to plan and will go to [facility] if bleeding worsens.

## 2012-06-16 ENCOUNTER — Encounter: Payer: Self-pay | Admitting: Obstetrics & Gynecology

## 2012-06-16 ENCOUNTER — Ambulatory Visit (INDEPENDENT_AMBULATORY_CARE_PROVIDER_SITE_OTHER): Payer: Medicaid Other | Admitting: Obstetrics & Gynecology

## 2012-06-16 ENCOUNTER — Other Ambulatory Visit (HOSPITAL_COMMUNITY)
Admission: RE | Admit: 2012-06-16 | Discharge: 2012-06-16 | Disposition: A | Discharge status: Home / Self Care | Payer: Medicaid Other | Source: Ambulatory Visit | Attending: Obstetrics & Gynecology | Admitting: Obstetrics & Gynecology

## 2012-06-16 VITALS — BP 132/86 | HR 94 | Temp 97.7°F | Ht 64.5 in | Wt 207.1 lb

## 2012-06-16 DIAGNOSIS — N938 Other specified abnormal uterine and vaginal bleeding: Secondary | ICD-10-CM | POA: Insufficient documentation

## 2012-06-16 DIAGNOSIS — D259 Leiomyoma of uterus, unspecified: Secondary | ICD-10-CM

## 2012-06-16 DIAGNOSIS — Z23 Encounter for immunization: Secondary | ICD-10-CM

## 2012-06-16 DIAGNOSIS — N949 Unspecified condition associated with female genital organs and menstrual cycle: Secondary | ICD-10-CM

## 2012-06-16 DIAGNOSIS — D219 Benign neoplasm of connective and other soft tissue, unspecified: Secondary | ICD-10-CM

## 2012-06-16 LAB — POCT PREGNANCY, URINE: Preg Test, Ur: NEGATIVE

## 2012-06-16 MED ORDER — TETANUS-DIPHTH-ACELL PERTUSSIS 5-2.5-18.5 LF-MCG/0.5 IM SUSP
0.5000 mL | Freq: Once | INTRAMUSCULAR | Status: AC
  Administered 2012-06-16: 0.5 mL via INTRAMUSCULAR

## 2012-06-16 MED ORDER — NORETHIN-ETH ESTRADIOL-FE 0.4-35 MG-MCG PO CHEW: 1.0000 | CHEWABLE_TABLET | Freq: Every day | ORAL | Status: DC

## 2012-06-16 NOTE — Progress Notes (Signed)
Subjective:     Patient ID: Monique [name], female   DOB: 1976/05/11, 36 y.o.   MRN: [id number]  HPI Pt c/o 3 months of heavy vaginal bleeding.  Pt c/o using 1 pack of pads in 2 1/2 days.  Had sx previously but, had ovarian cyst removed and sx improved.  Pt denies weight loss of other adverse sx.  Recently had Pap and Cx done.     Review of Systems     Objective:   Physical Exam BP 132/86  Pulse 94  Temp 97.7 F (36.5 C)  Ht 5' 4.5" (1.638 m)  Wt 207 lb 1.6 oz (93.94 kg)  BMI 35.00 kg/m2  LMP 06/10/2012 The indications for endometrial biopsy were reviewed.   Risks of the biopsy including cramping, bleeding, infection, uterine perforation, inadequate specimen and need for additional procedures  were discussed. The patient states she understands and agrees to undergo procedure today. Consent was signed. Time out was performed. Urine HCG was negative. A sterile speculum was placed in the patient's vagina and the cervix was prepped with Betadine. A single-toothed tenaculum was placed on the anterior lip of the cervix to stabilize it. The 3 mm pipelle was introduced into the endometrial cavity without difficulty to a depth of 8cm, and a moderate amount of tissue was obtained and sent to pathology. The instruments were removed from the patient's vagina. Minimal bleeding from the cervix was noted. The patient tolerated the procedure well.    06/02/12 Diagnosis NEGATIVE FOR INTRAEPITHELIAL LESIONS OR MALIGNANCY. JAMIE BRADY Cytotechnologist Electronic Signature (Case signed 06/07/2012) Specimen Clinical Information Family Hx: Positive Source CervicoVaginal Pap [ThinPrep Imaged] Ancillary Testing Chlamydia T. CT: Negative APTIMA COMBO 2 assay on the Pant[employer] DTS system is a target amplification nucleic acid probe test that utilizes target capture for in vitro qualitative detection and differentiation of ribosomal RNA from Chlaymdia trachomatis (CT) and/or Neisseria gonorrhoeae (NG). This  test was validated and its appropriate performance characteristics determined by the reporting laboratory. This laboratory is certified under the CLIA-88 as qualified to perform high complexity clinical laboratory testing. Completed by JB on 2012-06-03 HPV High Risk High Risk HPV: NOT DETECTED APTIMA HPV assay on the [employer]her DTS system is an in vitro nucleic acid amplification test for  06/10/12 Sono Findings:  Uterus: The uterus is enlarged with a sagittal length of 11.4 cm,  depth of 5.7 cm and width of 6.9 cm. A focal fibroid is identified  in the anterior fundal region measuring 5.1 x 4.7 x 5.5 cm. This  deviates the endometrial canal posteriorly and may have a small  submucosal component but is predominately mural in location. A  second small fibroid is identified in the right lateral anterior  midbody measuring 2.8 x 1.7 x 2.9 cm. This is mural in location.  Endometrium: Appears trilayered with a width of 7.1 mm. No areas  of focal thickening or heterogeneity are seen and this would  correlate with a periovulatory endometrial stripe and correspond  with the provided LMP of 09/20 04/2012.  Right ovary: Is not seen with confidence either transabdominally  or endovaginally  Left ovary: Measures 3.7 x 4.1 x 3.1 cm and contains a small  hemorrhagic corpus luteum  Other findings: A trace of simple free fluid is noted in the cul-de-  sac. No separate adnexal masses are seen.  IMPRESSION:  fibroid uterus with fibroid sizes and locations as noted above.  Normal periovulatory endometrial stripe and the left ovary. Non-  visualized right ovary  Assessment:     Menorrhagia-  Thought due to uterine fibroids    s/p endo bx Routine post-procedure instructions were given to the patient. The patient will follow up to review the results and for further management.    D/W pt treatment options including hyst, Mirena or OCP's  Plan:     S/p Endo bx Pt given info on Mirena and  Hyst Femcon 1 po q day F/u 3 months or sooner prn  Williette Loewe L. Harraway-Smith, M.D., [facility]

## 2012-06-16 NOTE — Patient Instructions (Signed)
Oral Contraception Information Oral contraceptives (OCs) are medicines taken to prevent pregnancy. OCs work by preventing the ovaries from releasing eggs. The hormones in OCs also cause the cervical mucus to thicken, preventing the sperm from entering the uterus. The hormones also cause the uterine lining to become thin, not allowing a fertilized egg to attach to the inside of the uterus. OCs are highly effective when taken exactly as prescribed. However, OCs do not prevent sexually transmitted diseases (STDs). Safe sex practices, such as using condoms along with the pill, can help prevent STDs.  Before taking the pill, you may have a physical exam and Pap test. Your caregiver may order blood tests that may be necessary. Your caregiver will make sure you are a good candidate for oral contraception. Discuss with your caregiver the possible side effects of the OC you may be prescribed. When starting an OC, it can take 2 to 3 months for the body to adjust to the changes in hormone levels in your body.  TYPES OF ORAL CONTRACEPTION  The combination pill. This pill contains estrogen and progestin (synthetic progesterone) hormones. The combination pill comes in either 21-day or 28-day packs. With 21-day packs, you do not take pills for 7 days after the last pill. With 28-day packs, the pill is taken every day. The last 7 pills are without hormones. Certain types of pills have more than 21 hormone-containing pills.  The minipill. This pill contains the progesterone hormone only. It is taken every day continuously. The minipill comes in packs of 91 pills. The first 84 pills contain the hormones, and the last 7 pills do not. The last 7 days are when you will have your menstrual period. You may experience irregular spotting. ADVANTAGES  Decreases premenstrual symptoms.  Treats menstrual period cramps.  Regulates the menstrual cycle.  Decreases a heavy menstrual flow.  Treats acne.  Treats abnormal uterine  bleeding.  Treats chronic pelvic pain.  Treats polycystic ovarian syndrome.  Treats endometriosis.  Can be used as emergency contraception. DISADVANTAGES OCs can be less effective if:  You forget to take the pill at the same time every day.  You have a stomach or intestinal disease that lessens the absorption of the pill.  You take OCs with other medicines that make OCs less effective.  You take expired OCs.  You forget to restart the pill on day 7, when using the packs of 21 pills. Document Released: 11/01/2002 Document Revised: 11/03/2011 Document Reviewed: 12/18/2010 [facility] Patient Information 2013 [place], [place]. Hysterectomy Information  A hysterectomy is a procedure where your uterus is surgically removed. It will no longer be possible to have menstrual periods or to become pregnant. The tubes and ovaries can be removed (bilateral salpingo-oopherectomy) during this surgery as well.  REASONS FOR A HYSTERECTOMY  Persistent, abnormal bleeding.  Lasting (chronic) pelvic pain or infection.  The lining of the uterus (endometrium) starts growing outside the uterus (endometriosis).  The endometrium starts growing in the muscle of the uterus (adenomyosis).  The uterus falls down into the vagina (pelvic organ prolapse).  Symptomatic uterine fibroids.  Precancerous cells.  Cervical cancer or uterine cancer. TYPES OF HYSTERECTOMIES  Supracervical hysterectomy. This type removes the top part of the uterus, but not the cervix.  Total hysterectomy. This type removes the uterus and cervix.  Radical hysterectomy. This type removes the uterus, cervix, and the fibrous tissue that holds the uterus in place in the pelvis (parametrium). WAYS A HYSTERECTOMY CAN BE PERFORMED  Abdominal hysterectomy. A large  surgical cut (incision) is made in the abdomen. The uterus is removed through this incision.  Vaginal hysterectomy. An incision is made in the vagina. The uterus is removed  through this incision. There are no abdominal incisions.  Conventional laparoscopic hysterectomy. A thin, lighted tube with a camera (laparoscope) is inserted into 3 or 4 small incisions in the abdomen. The uterus is cut into small pieces. The small pieces are removed through the incisions, or they are removed through the vagina.  Laparoscopic assisted vaginal hysterectomy (LAVH). Three or four small incisions are made in the abdomen. Part of the surgery is performed laparoscopically and part vaginally. The uterus is removed through the vagina.  Robot-assisted laparoscopic hysterectomy. A laparoscope is inserted into 3 or 4 small incisions in the abdomen. A computer-controlled device is used to give the surgeon a 3D image. This allows for more precise movements of surgical instruments. The uterus is cut into small pieces and removed through the incisions or removed through the vagina. RISKS OF HYSTERECTOMY   Bleeding and risk of blood transfusion. Tell your caregiver if you do not want to receive any blood products.  Blood clots in the legs or lung.  Infection.  Injury to surrounding organs.  Anesthesia problems or side effects.  Conversion to an abdominal hysterectomy. WHAT TO EXPECT AFTER A HYSTERECTOMY  You will be given pain medicine.  You will need to have someone with you for the first 3 to 5 days after you go home.  You will need to follow up with your surgeon in 2 to 4 weeks after surgery to evaluate your progress.  You may have early menopause symptoms like hot flashes, night sweats, and insomnia.  If you had a hysterectomy for a problem that was not a cancer or a condition that could lead to cancer, then you no longer need Pap tests. However, even if you no longer need a Pap test, a regular exam is a good idea to make sure no other problems are starting. Document Released: 02/04/2001 Document Revised: 11/03/2011 Document Reviewed: 03/22/2011 [facility] Patient Information  2013 [place], [place].

## 2012-06-17 ENCOUNTER — Encounter: Payer: Self-pay | Admitting: Obstetrics & Gynecology

## 2012-06-18 ENCOUNTER — Encounter: Payer: Self-pay | Admitting: Obstetrics & Gynecology

## 2012-06-18 ENCOUNTER — Ambulatory Visit: Payer: Medicaid Other

## 2012-06-21 ENCOUNTER — Telehealth: Payer: Self-pay

## 2012-06-21 NOTE — Telephone Encounter (Signed)
Message copied by Faythe Casa on Mon Jun 21, 2012  1:35 PM ------      Message from: Willodean Rosenthal      Created: Mon Jun 21, 2012 10:48 AM       Please notify pt of normal Endobx.  Rec f/u as previously instructed.            Thx,      clh-S

## 2012-06-21 NOTE — Telephone Encounter (Signed)
Called pt and informed pt of her normal results of endo bx.  Pt stated that she is still having pain and tylenol and ibuprofen is not helping and I think that I want to do surgery.  I advised pt to make a follow up appointment to discuss with a provider and a possible surgical consult.  Pt stated understanding and I transferred her call to the front desk in which the call was dropped and I gave pt info to Antoinette to contact patient to schedule an appt.

## 2012-06-25 ENCOUNTER — Emergency Department (HOSPITAL_COMMUNITY)
Admission: EM | Admit: 2012-06-25 | Discharge: 2012-06-25 | Disposition: A | Discharge status: Home / Self Care | Payer: Medicaid Other | Source: Home / Self Care | Attending: Emergency Medicine | Admitting: Emergency Medicine

## 2012-06-25 ENCOUNTER — Emergency Department (INDEPENDENT_AMBULATORY_CARE_PROVIDER_SITE_OTHER): Payer: Medicaid Other

## 2012-06-25 ENCOUNTER — Encounter (HOSPITAL_COMMUNITY): Payer: Self-pay | Admitting: Emergency Medicine

## 2012-06-25 DIAGNOSIS — R071 Chest pain on breathing: Secondary | ICD-10-CM

## 2012-06-25 DIAGNOSIS — R0789 Other chest pain: Secondary | ICD-10-CM

## 2012-06-25 MED ORDER — TRAMADOL HCL 50 MG PO TABS: 50.0000 mg | ORAL_TABLET | Freq: Four times a day (QID) | ORAL | Status: DC | PRN

## 2012-06-25 NOTE — ED Notes (Addendum)
Called to evaluate this pt. Pt c/o chest pain and LEFT arm going numb x 1 year. Pt states that she has been to "several doctors and had all kinds of tests run and no one knows what is wrong. They just keep putting me on all kinds of medicines and it's making me feel like a drug addict." Pt is speaking in multiple word sentences without taking breaths in between words in same sentence. Pt describes pain as a soreness. Pt has full ROM of LEFT arm and PMS is intact. Pt denies SOB, N/V/D, headache, dizziness, abd pain. Pt instructed to wait in the waiting area and to inform staff if anything in her condition/complaint changed prior to being called to the treatment area. Pt verbalized her understanding of this instruction.

## 2012-06-25 NOTE — ED Notes (Signed)
Spoke to dr Ladon Applebaum about patient and complaint.

## 2012-06-25 NOTE — ED Notes (Signed)
Requested work note.

## 2012-06-25 NOTE — ED Notes (Signed)
Placed in gown.

## 2012-06-25 NOTE — ED Notes (Signed)
Patient reports chest tightness for 3 days ( 10/29).  Torso pain is ongoing and has been evaluated by multiple physicians.  Numbness to left finger tips started around the same time as chest tightness-numbness involves tips of middle and ring finger of left hand.

## 2012-06-25 NOTE — ED Notes (Signed)
Requested S. Fox rad Programmer, multimedia.

## 2012-06-25 NOTE — ED Provider Notes (Signed)
History     CSN: [id number]  Arrival date & time 06/25/12  1127   First MD Initiated Contact with Patient 06/25/12 1151      Chief Complaint  Patient presents with  . Chest Pain    (Consider location/radiation/quality/duration/timing/severity/associated sxs/prior treatment) HPI Comments: Patient presents urgent care this afternoon complaining of ongoing sensation of chest tightness (point [place] mid retrosternal region), pain exacerbates with activity and movement and she describes it although slightly different within the last 3-4 days she's been having multiple areas of pain on her back and chest areas that exacerbates with minimal palpation and movement. She denies any shortness of breath, fevers, or active cough. She also seems to be describing that she has felt some intermittent numbness in the very tip of her middle and ring finger of her left hand denies any weakness of any of her upper or lower extremities. Patient also denies constitutional symptoms such as fevers, unintentional weight loss myalgias or arthralgias.  The history is provided by the patient.    Past Medical History  Diagnosis Date  . Chronic headaches   . Skin abnormalities     unnknown skin dx, extremely dry skin  . Alopecia   . Hypertension   . Anemia   . Hemorrhoids   . Abnormal Pap smear     Past Surgical History  Procedure Date  . Leep 2001 or 2002  . Unknown     "some surgery to remove fibroid cyst that started with an 'E' "  . Leep   . Diagnostic laparoscopy 2009    Rt ovarian cyst removal    Family History  Problem Relation Age of Onset  . Cancer Mother     cervical  . Arthritis Mother   . Hypertension Mother   . Cancer Brother   . Hypertension Brother   . Heart disease Father   . Hypertension Father   . Diabetes Sister   . Heart disease Sister   . Hypertension Sister     History  Substance Use Topics  . Smoking status: Never Smoker   . Smokeless tobacco: Never Used  .  Alcohol Use: 0.5 oz/week    1 drink(s) per week     socially    OB History    Grav Para Term Preterm Abortions TAB SAB Ect Mult Living   2 2 2  0 0 0 0 0 0 2      Review of Systems  Constitutional: Negative for chills, diaphoresis, activity change, appetite change and fatigue.  HENT: Negative for neck pain.   Respiratory: Positive for chest tightness. Negative for cough and shortness of breath.   Cardiovascular: Negative for chest pain and leg swelling.  Skin: Negative for rash and wound.    Allergies  Review of patient's allergies indicates no known allergies.  Home Medications   Current Outpatient Rx  Name Route Sig Dispense Refill  . FERROUS FUMARATE 325 (106 FE) MG PO TABS Oral Take 1 tablet by mouth daily.    Kathrynn Running ESTRADIOL-FE 0.4-35 MG-MCG PO CHEW Oral Chew 1 tablet by mouth daily. 1 Package 11  . POTASSIUM CHLORIDE CRYS ER 10 MEQ PO TBCR Oral Take 10 mEq by mouth daily.    Marland Kitchen PREGABALIN 75 MG PO CAPS Oral Take 75 mg by mouth daily.    . TOPAMAX PO Oral Take 50 mg by mouth daily. For migraine prevention    . TRAMADOL HCL 50 MG PO TABS Oral Take 1 tablet (50 mg total) by  mouth every 6 (six) hours as needed for pain. 15 tablet 0  . VERAPAMIL HCL PO Oral Take 80 mg by mouth daily.       BP 146/92  Pulse 86  Temp 98.9 F (37.2 C) (Oral)  Resp 18  SpO2 99%  LMP 06/10/2012  Physical Exam  Nursing note and vitals reviewed. Constitutional: Vital signs are normal. She appears well-developed and well-nourished.  Non-toxic appearance. She does not have a sickly appearance. She does not appear ill. No distress.  HENT:  Head: Normocephalic.  Eyes: Pupils are equal, round, and reactive to light.  Neck: Neck supple. No JVD present.  Pulmonary/Chest: Effort normal and breath sounds normal. No respiratory distress. She has no decreased breath sounds. She has no wheezes. She has no rales. She exhibits bony tenderness. She exhibits no tenderness, no crepitus, no edema, no  deformity, no swelling and no retraction.    Musculoskeletal: Normal range of motion. She exhibits tenderness.  Neurological: She is alert.  Skin: No rash noted. No erythema.    ED Course  Procedures (including critical care time)  Labs Reviewed - No data to display Dg Chest 2 View  06/25/2012  *RADIOLOGY REPORT*  Clinical Data: 36 year old female chest pain.  CHEST - 2 VIEW  Comparison: 01/28/2010.  Findings: Lower lung volumes.  Cardiac and mediastinal contours remain normal. Visualized tracheal air column is within normal limits.  No pneumothorax or pulmonary edema.  No pleural effusion or confluent pulmonary opacity. No acute osseous abnormality identified.  IMPRESSION: No acute cardiopulmonary abnormality.   Original Report Authenticated By: Odessa Fleming III, M.D.      1. Anterior chest wall pain    An EKG done at 14 hours reveal normal sinus rhythm no PR QRS or ST interval abnormalities. Ventricular rate of 75 beats per minute. Patient also had a bedside informational (non-diagnostic)echocardiogram at bedside which revealed no obvious valvular dysfunctions or pericardial effusion.   MDM  Exam was highly suggestive of musculoskeletal anterior chest wall pain. Patient has already scheduled a followup appointment to see her primary care doctor on Monday. Was prescribed 10 tablets of tramadol for pain discomfort. Patient has no other symptoms to suggest acute cardiovascular condition or thromboembolic event. At this point uncertain about the etiology or cause for her current discomfort.        Jimmie Molly, MD 06/25/12 1600

## 2012-07-07 ENCOUNTER — Encounter: Payer: Medicaid Other | Admitting: Obstetrics & Gynecology

## 2012-07-14 ENCOUNTER — Other Ambulatory Visit: Payer: Self-pay | Admitting: Obstetrics & Gynecology

## 2012-08-03 ENCOUNTER — Encounter (HOSPITAL_COMMUNITY): Payer: Self-pay | Admitting: Pharmacist

## 2012-08-09 ENCOUNTER — Encounter (HOSPITAL_COMMUNITY): Payer: Self-pay

## 2012-08-09 ENCOUNTER — Encounter (HOSPITAL_COMMUNITY)
Admission: RE | Admit: 2012-08-09 | Discharge: 2012-08-09 | Disposition: A | Discharge status: Home / Self Care | Payer: Medicaid Other | Source: Ambulatory Visit | Attending: Obstetrics & Gynecology | Admitting: Obstetrics & Gynecology

## 2012-08-09 HISTORY — DX: Fibromyalgia: M79.7

## 2012-08-09 LAB — BASIC METABOLIC PANEL
CO2: 25 mEq/L (ref 19–32)
Calcium: 8.9 mg/dL (ref 8.4–10.5)
Glucose, Bld: 147 mg/dL — ABNORMAL HIGH (ref 70–99)
Potassium: 3.2 mEq/L — ABNORMAL LOW (ref 3.5–5.1)
Sodium: 136 mEq/L (ref 135–145)

## 2012-08-09 LAB — CBC
Hemoglobin: 10.9 g/dL — ABNORMAL LOW (ref 12.0–15.0)
MCH: 26.7 pg (ref 26.0–34.0)
RBC: 4.08 MIL/uL (ref 3.87–5.11)

## 2012-08-09 LAB — SURGICAL PCR SCREEN: MRSA, PCR: NEGATIVE

## 2012-08-09 NOTE — Patient Instructions (Addendum)
19 Monique [name]  08/09/2012   Your procedure is scheduled on:  08/19/12  Enter through the Main Entrance of [facility] at 6 AM.  Pick up the phone at the desk and dial [phone].   Call this number if you have problems the morning of surgery: [phone]   Remember:   Do not eat food:After Midnight.  Do not drink clear liquids: After Midnight.  Take these medicines the morning of surgery with A SIP OF WATER: Blood pressure medication and potassium   Do not wear jewelry, make-up or nail polish.  Do not wear lotions, powders, or perfumes. You may wear deodorant.  Do not shave 48 hours prior to surgery.  Do not bring valuables to the hospital.  Contacts, dentures or bridgework may not be worn into surgery.  Leave suitcase in the car. After surgery it may be brought to your room.  For patients admitted to the hospital, checkout time is 11:00 AM the day of discharge.   Patients discharged the day of surgery will not be allowed to drive home.  Name and phone number of your driver: NA  Special Instructions: Shower using CHG 2 nights before surgery and the night before surgery.  If you shower the day of surgery use CHG.  Use special wash - you have one bottle of CHG for all showers.  You should use approximately 1/3 of the bottle for each shower.   Please read over the following fact sheets that you were given: MRSA Information

## 2012-08-09 NOTE — Pre-Procedure Instructions (Signed)
K+ 3.2 reported to MD office Sue Lush) and Dr Arby Barrette. No orders given.

## 2012-08-18 NOTE — H&P (Signed)
  Subjective:  Monique [name] is a 36 y.o.  female with irregular bleeding. Bleeding is characterized as moderate. Length of bleeding: 5 days.  Associated symptoms include pelvic pain.  Evaluation to date: pelvic ultrasound: positive for uterine fibroids. Treatment to date: Aygestin  Pertinent Gyn History:  Menses irregular occurring approximately every 14 days with spotting approximately 5 days per month Bleeding: see above Preventive screening:   Last pap: normal Date: 10/13  Patient Active Problem List   Diagnosis Date Noted  . DUB (dysfunctional uterine bleeding) 06/02/2012   Past Medical History  Diagnosis Date  . Chronic headaches   . Skin abnormalities     unnknown skin dx, extremely dry skin  . Alopecia   . Hypertension   . Anemia   . Hemorrhoids   . Abnormal Pap smear   . Fibromyalgia     Past Surgical History  Procedure Date  . Leep 2001 or 2002  . Unknown     "some surgery to remove fibroid cyst that started with an 'E' "  . Leep   . Diagnostic laparoscopy 2009    Rt ovarian cyst removal    No prescriptions prior to admission   No Known Allergies  History  Substance Use Topics  . Smoking status: Never Smoker   . Smokeless tobacco: Never Used  . Alcohol Use: 0.5 oz/week    1 drink(s) per week     Comment: socially    Family History  Problem Relation Age of Onset  . Cancer Mother     cervical  . Arthritis Mother   . Hypertension Mother   . Cancer Brother   . Hypertension Brother   . Heart disease Father   . Hypertension Father   . Diabetes Sister   . Heart disease Sister   . Hypertension Sister      Review of Systems Pertinent items are noted in HPI.    Objective:   Vital signs in last 24 hours:    General:   alert  Skin:   normal  HEENT:  PERRLA  Lungs:   clear to auscultation bilaterally  Heart:   regular rate and rhythm, S1, S2 normal, no murmur, click, rub or gallop  Breasts:   normal without suspicious masses, skin or nipple  changes or axillary nodes  Abdomen:  soft, non-tender; bowel sounds normal; no masses,  no organomegaly  Pelvis:  External genitalia: normal general appearance Vaginal: normal mucosa without prolapse or lesions Cervix: normal appearance Adnexa: normal bimanual exam Uterus: anteverted, enlarged and approximately 10 - 12 weeks aggregate size                                     Assessment/Plan: AUB-L, O   I had a lengthy discussion with the patient regarding her bleeding and consideration for endometrial ablation versus hysterectomy.  Procedure, risks, reasons, benefits and complications (including injury to bowel, bladder, major blood vessel, ureter, bleeding, possibility of transfusion, infection, or fistula formation) were reviewed in detail. Consent was signed and preop testing was ordered.  Instructions were reviewed, including NPO after midnight.

## 2012-08-19 ENCOUNTER — Encounter (HOSPITAL_COMMUNITY)
Admission: RE | Disposition: A | Discharge status: Home / Self Care | Payer: Self-pay | Source: Ambulatory Visit | Attending: Obstetrics & Gynecology

## 2012-08-19 ENCOUNTER — Ambulatory Visit (HOSPITAL_COMMUNITY): Payer: Medicaid Other | Admitting: Anesthesiology

## 2012-08-19 ENCOUNTER — Inpatient Hospital Stay (HOSPITAL_COMMUNITY)
Admission: RE | Admit: 2012-08-19 | Discharge: 2012-08-21 | DRG: 743 | Disposition: A | Discharge status: Home / Self Care | Payer: Medicaid Other | Source: Ambulatory Visit | Attending: Obstetrics & Gynecology | Admitting: Obstetrics & Gynecology

## 2012-08-19 ENCOUNTER — Encounter (HOSPITAL_COMMUNITY): Payer: Self-pay | Admitting: *Deleted

## 2012-08-19 ENCOUNTER — Encounter (HOSPITAL_COMMUNITY): Payer: Self-pay | Admitting: Anesthesiology

## 2012-08-19 DIAGNOSIS — E876 Hypokalemia: Secondary | ICD-10-CM | POA: Diagnosis not present

## 2012-08-19 DIAGNOSIS — N938 Other specified abnormal uterine and vaginal bleeding: Principal | ICD-10-CM | POA: Diagnosis present

## 2012-08-19 DIAGNOSIS — R350 Frequency of micturition: Secondary | ICD-10-CM

## 2012-08-19 DIAGNOSIS — D259 Leiomyoma of uterus, unspecified: Secondary | ICD-10-CM | POA: Diagnosis present

## 2012-08-19 DIAGNOSIS — Z23 Encounter for immunization: Secondary | ICD-10-CM

## 2012-08-19 DIAGNOSIS — Z90710 Acquired absence of both cervix and uterus: Secondary | ICD-10-CM

## 2012-08-19 DIAGNOSIS — N949 Unspecified condition associated with female genital organs and menstrual cycle: Secondary | ICD-10-CM | POA: Diagnosis present

## 2012-08-19 HISTORY — PX: BILATERAL SALPINGECTOMY: SHX5743

## 2012-08-19 HISTORY — PX: ROBOTIC ASSISTED TOTAL HYSTERECTOMY: SHX6085

## 2012-08-19 LAB — TYPE AND SCREEN
ABO/RH(D): AB POS
Antibody Screen: NEGATIVE

## 2012-08-19 LAB — ABO/RH: ABO/RH(D): AB POS

## 2012-08-19 SURGERY — ROBOTIC ASSISTED TOTAL HYSTERECTOMY
Anesthesia: General | Site: Abdomen | Wound class: Clean Contaminated

## 2012-08-19 MED ORDER — ONDANSETRON HCL 4 MG/2ML IJ SOLN
INTRAMUSCULAR | Status: DC | PRN
  Administered 2012-08-19: 4 mg via INTRAVENOUS

## 2012-08-19 MED ORDER — KETOROLAC TROMETHAMINE 30 MG/ML IJ SOLN
30.0000 mg | Freq: Once | INTRAMUSCULAR | Status: AC
  Administered 2012-08-19: 30 mg via INTRAVENOUS

## 2012-08-19 MED ORDER — ACETAMINOPHEN 10 MG/ML IV SOLN
INTRAVENOUS | Status: DC | PRN
  Administered 2012-08-19: 1000 mg via INTRAVENOUS

## 2012-08-19 MED ORDER — ACETAMINOPHEN 10 MG/ML IV SOLN
INTRAVENOUS | Status: AC
  Filled 2012-08-19: qty 100

## 2012-08-19 MED ORDER — FENTANYL CITRATE 0.05 MG/ML IJ SOLN
25.0000 ug | INTRAMUSCULAR | Status: DC | PRN
  Administered 2012-08-19 (×2): 50 ug via INTRAVENOUS

## 2012-08-19 MED ORDER — KETOROLAC TROMETHAMINE 30 MG/ML IJ SOLN
30.0000 mg | Freq: Four times a day (QID) | INTRAMUSCULAR | Status: DC
  Administered 2012-08-19 – 2012-08-20 (×4): 30 mg via INTRAVENOUS
  Filled 2012-08-19 (×4): qty 1

## 2012-08-19 MED ORDER — NEOSTIGMINE METHYLSULFATE 1 MG/ML IJ SOLN
INTRAMUSCULAR | Status: AC
  Filled 2012-08-19: qty 1

## 2012-08-19 MED ORDER — KETOROLAC TROMETHAMINE 30 MG/ML IJ SOLN
INTRAMUSCULAR | Status: AC
  Administered 2012-08-19: 30 mg via INTRAVENOUS
  Filled 2012-08-19: qty 1

## 2012-08-19 MED ORDER — FENTANYL CITRATE 0.05 MG/ML IJ SOLN
INTRAMUSCULAR | Status: DC | PRN
  Administered 2012-08-19: 50 ug via INTRAVENOUS
  Administered 2012-08-19: 100 ug via INTRAVENOUS
  Administered 2012-08-19 (×7): 50 ug via INTRAVENOUS

## 2012-08-19 MED ORDER — MENTHOL 3 MG MT LOZG: 1.0000 | LOZENGE | OROMUCOSAL | Status: DC | PRN

## 2012-08-19 MED ORDER — FENTANYL CITRATE 0.05 MG/ML IJ SOLN
INTRAMUSCULAR | Status: AC
  Filled 2012-08-19: qty 5

## 2012-08-19 MED ORDER — TOPIRAMATE 25 MG PO TABS
50.0000 mg | ORAL_TABLET | Freq: Every day | ORAL | Status: DC
  Administered 2012-08-20 – 2012-08-21 (×2): 50 mg via ORAL
  Filled 2012-08-19 (×2): qty 2

## 2012-08-19 MED ORDER — FENTANYL CITRATE 0.05 MG/ML IJ SOLN
INTRAMUSCULAR | Status: AC
  Administered 2012-08-19: 50 ug via INTRAVENOUS
  Filled 2012-08-19: qty 2

## 2012-08-19 MED ORDER — NEOSTIGMINE METHYLSULFATE 1 MG/ML IJ SOLN
INTRAMUSCULAR | Status: DC | PRN
  Administered 2012-08-19: 3 mg via INTRAVENOUS

## 2012-08-19 MED ORDER — BUPIVACAINE HCL (PF) 0.25 % IJ SOLN
INTRAMUSCULAR | Status: DC | PRN
  Administered 2012-08-19: 7 mL

## 2012-08-19 MED ORDER — KETOROLAC TROMETHAMINE 30 MG/ML IJ SOLN: 15.0000 mg | Freq: Once | INTRAMUSCULAR | Status: DC | PRN

## 2012-08-19 MED ORDER — VERAPAMIL HCL 80 MG PO TABS
80.0000 mg | ORAL_TABLET | Freq: Every morning | ORAL | Status: DC
  Administered 2012-08-20 – 2012-08-21 (×2): 80 mg via ORAL
  Filled 2012-08-19 (×2): qty 1

## 2012-08-19 MED ORDER — ARTIFICIAL TEARS OP OINT
TOPICAL_OINTMENT | OPHTHALMIC | Status: AC
  Filled 2012-08-19: qty 3.5

## 2012-08-19 MED ORDER — ZOLPIDEM TARTRATE 5 MG PO TABS: 5.0000 mg | ORAL_TABLET | Freq: Every evening | ORAL | Status: DC | PRN

## 2012-08-19 MED ORDER — POTASSIUM CHLORIDE CRYS ER 10 MEQ PO TBCR
10.0000 meq | EXTENDED_RELEASE_TABLET | Freq: Every day | ORAL | Status: DC
  Filled 2012-08-19: qty 1

## 2012-08-19 MED ORDER — BUPIVACAINE HCL (PF) 0.25 % IJ SOLN
INTRAMUSCULAR | Status: AC
  Filled 2012-08-19: qty 30

## 2012-08-19 MED ORDER — LIDOCAINE HCL (CARDIAC) 20 MG/ML IV SOLN
INTRAVENOUS | Status: DC | PRN
  Administered 2012-08-19: 50 mg via INTRAVENOUS
  Administered 2012-08-19: 30 mg via INTRAVENOUS

## 2012-08-19 MED ORDER — ROCURONIUM BROMIDE 100 MG/10ML IV SOLN
INTRAVENOUS | Status: DC | PRN
  Administered 2012-08-19 (×2): 10 mg via INTRAVENOUS
  Administered 2012-08-19: 5 mg via INTRAVENOUS
  Administered 2012-08-19: 15 mg via INTRAVENOUS
  Administered 2012-08-19: 40 mg via INTRAVENOUS

## 2012-08-19 MED ORDER — SIMETHICONE 80 MG PO CHEW
80.0000 mg | CHEWABLE_TABLET | Freq: Four times a day (QID) | ORAL | Status: DC | PRN
  Administered 2012-08-20: 80 mg via ORAL

## 2012-08-19 MED ORDER — CEFAZOLIN SODIUM-DEXTROSE 2-3 GM-% IV SOLR
2.0000 g | INTRAVENOUS | Status: AC
  Administered 2012-08-19: 2 g via INTRAVENOUS

## 2012-08-19 MED ORDER — ONDANSETRON HCL 4 MG/2ML IJ SOLN
INTRAMUSCULAR | Status: AC
  Filled 2012-08-19: qty 2

## 2012-08-19 MED ORDER — ROCURONIUM BROMIDE 50 MG/5ML IV SOLN
INTRAVENOUS | Status: AC
  Filled 2012-08-19: qty 1

## 2012-08-19 MED ORDER — PROPOFOL 10 MG/ML IV EMUL
INTRAVENOUS | Status: DC | PRN
  Administered 2012-08-19: 180 mg via INTRAVENOUS

## 2012-08-19 MED ORDER — MIDAZOLAM HCL 2 MG/2ML IJ SOLN
INTRAMUSCULAR | Status: AC
  Filled 2012-08-19: qty 2

## 2012-08-19 MED ORDER — PROPOFOL 10 MG/ML IV EMUL
INTRAVENOUS | Status: AC
  Filled 2012-08-19: qty 20

## 2012-08-19 MED ORDER — MIDAZOLAM HCL 5 MG/5ML IJ SOLN
INTRAMUSCULAR | Status: DC | PRN
  Administered 2012-08-19: 2 mg via INTRAVENOUS

## 2012-08-19 MED ORDER — PHENYLEPHRINE 40 MCG/ML (10ML) SYRINGE FOR IV PUSH (FOR BLOOD PRESSURE SUPPORT)
PREFILLED_SYRINGE | INTRAVENOUS | Status: AC
  Filled 2012-08-19: qty 5

## 2012-08-19 MED ORDER — KETOROLAC TROMETHAMINE 30 MG/ML IJ SOLN: 30.0000 mg | Freq: Four times a day (QID) | INTRAMUSCULAR | Status: DC

## 2012-08-19 MED ORDER — MORPHINE SULFATE 4 MG/ML IJ SOLN
1.0000 mg | INTRAMUSCULAR | Status: DC | PRN
  Administered 2012-08-20: 2 mg via INTRAVENOUS
  Filled 2012-08-19: qty 1

## 2012-08-19 MED ORDER — CEFAZOLIN SODIUM-DEXTROSE 2-3 GM-% IV SOLR
INTRAVENOUS | Status: AC
  Filled 2012-08-19: qty 50

## 2012-08-19 MED ORDER — PREGABALIN 75 MG PO CAPS
75.0000 mg | ORAL_CAPSULE | Freq: Every day | ORAL | Status: DC
  Administered 2012-08-20 – 2012-08-21 (×2): 75 mg via ORAL
  Filled 2012-08-19 (×2): qty 1

## 2012-08-19 MED ORDER — OXYCODONE-ACETAMINOPHEN 5-325 MG PO TABS
1.0000 | ORAL_TABLET | ORAL | Status: DC | PRN
  Administered 2012-08-19 – 2012-08-20 (×2): 2 via ORAL
  Administered 2012-08-20: 1 via ORAL
  Administered 2012-08-20: 2 via ORAL
  Administered 2012-08-20: 1 via ORAL
  Filled 2012-08-19: qty 2
  Filled 2012-08-19: qty 1
  Filled 2012-08-19 (×2): qty 2
  Filled 2012-08-19 (×2): qty 1

## 2012-08-19 MED ORDER — GLYCOPYRROLATE 0.2 MG/ML IJ SOLN
INTRAMUSCULAR | Status: AC
  Filled 2012-08-19: qty 2

## 2012-08-19 MED ORDER — LIDOCAINE HCL (CARDIAC) 20 MG/ML IV SOLN
INTRAVENOUS | Status: AC
  Filled 2012-08-19: qty 5

## 2012-08-19 MED ORDER — ACETAMINOPHEN 10 MG/ML IV SOLN
1000.0000 mg | Freq: Once | INTRAVENOUS | Status: DC
  Filled 2012-08-19: qty 100

## 2012-08-19 MED ORDER — DEXTROSE-NACL 5-0.45 % IV SOLN
INTRAVENOUS | Status: DC
  Administered 2012-08-19 – 2012-08-20 (×3): via INTRAVENOUS

## 2012-08-19 MED ORDER — GLYCOPYRROLATE 0.2 MG/ML IJ SOLN
INTRAMUSCULAR | Status: DC | PRN
  Administered 2012-08-19: 0.4 mg via INTRAVENOUS

## 2012-08-19 MED ORDER — LACTATED RINGERS IV SOLN
INTRAVENOUS | Status: DC
  Administered 2012-08-19: 50 mL/h via INTRAVENOUS
  Administered 2012-08-19: 125 mL/h via INTRAVENOUS
  Administered 2012-08-19: 08:00:00 via INTRAVENOUS

## 2012-08-19 MED ORDER — DOCUSATE SODIUM 100 MG PO CAPS
100.0000 mg | ORAL_CAPSULE | Freq: Two times a day (BID) | ORAL | Status: DC
  Administered 2012-08-19 – 2012-08-21 (×4): 100 mg via ORAL
  Filled 2012-08-19 (×4): qty 1

## 2012-08-19 MED ORDER — DEXAMETHASONE SODIUM PHOSPHATE 10 MG/ML IJ SOLN
INTRAMUSCULAR | Status: AC
  Filled 2012-08-19: qty 1

## 2012-08-19 MED ORDER — DEXAMETHASONE SODIUM PHOSPHATE 4 MG/ML IJ SOLN
INTRAMUSCULAR | Status: DC | PRN
  Administered 2012-08-19: 10 mg via INTRAVENOUS

## 2012-08-19 MED ORDER — PANTOPRAZOLE SODIUM 40 MG PO TBEC
40.0000 mg | DELAYED_RELEASE_TABLET | Freq: Every day | ORAL | Status: DC
  Administered 2012-08-20 – 2012-08-21 (×2): 40 mg via ORAL
  Filled 2012-08-19 (×3): qty 1

## 2012-08-19 SURGICAL SUPPLY — 50 items
BAG URINE DRAINAGE (UROLOGICAL SUPPLIES) ×3 IMPLANT
BARRIER ADHS 3X4 INTERCEED (GAUZE/BANDAGES/DRESSINGS) ×3 IMPLANT
BRR ADH 4X3 ABS CNTRL BYND (GAUZE/BANDAGES/DRESSINGS) ×1
CABLE HIGH FREQUENCY MONO STRZ (ELECTRODE) ×3 IMPLANT
CATH FOLEY 3WAY  5CC 16FR (CATHETERS) ×1
CATH FOLEY 3WAY 5CC 16FR (CATHETERS) ×2 IMPLANT
CLOTH BEACON ORANGE TIMEOUT ST (SAFETY) ×3 IMPLANT
CONT PATH 16OZ SNAP LID 3702 (MISCELLANEOUS) ×3 IMPLANT
CORDS BIPOLAR (ELECTRODE) ×3 IMPLANT
COVER MAYO STAND STRL (DRAPES) ×3 IMPLANT
COVER TABLE BACK 60X90 (DRAPES) ×6 IMPLANT
COVER TIP SHEARS 8 DVNC (MISCELLANEOUS) ×4 IMPLANT
COVER TIP SHEARS 8MM DA VINCI (MISCELLANEOUS) ×2
DECANTER SPIKE VIAL GLASS SM (MISCELLANEOUS) ×3 IMPLANT
DERMABOND ADVANCED (GAUZE/BANDAGES/DRESSINGS) ×1
DERMABOND ADVANCED .7 DNX12 (GAUZE/BANDAGES/DRESSINGS) ×2 IMPLANT
DRAPE HUG U DISPOSABLE (DRAPE) ×3 IMPLANT
DRAPE LG THREE QUARTER DISP (DRAPES) ×6 IMPLANT
DRAPE WARM FLUID 44X44 (DRAPE) ×3 IMPLANT
ELECT REM PT RETURN 9FT ADLT (ELECTROSURGICAL) ×3
ELECTRODE REM PT RTRN 9FT ADLT (ELECTROSURGICAL) ×2 IMPLANT
EVACUATOR SMOKE 8.L (FILTER) ×3 IMPLANT
GLOVE BIO SURGEON STRL SZ 6.5 (GLOVE) ×9 IMPLANT
GLOVE BIO SURGEON STRL SZ8 (GLOVE) ×6 IMPLANT
GOWN STRL REIN XL XLG (GOWN DISPOSABLE) ×18 IMPLANT
LEGGING LITHOTOMY PAIR STRL (DRAPES) ×3 IMPLANT
MANIPULATOR UTERINE 4.5 ZUMI (MISCELLANEOUS) ×3 IMPLANT
OCCLUDER COLPOPNEUMO (BALLOONS) ×3 IMPLANT
PACK LAVH (CUSTOM PROCEDURE TRAY) ×3 IMPLANT
PAD PREP 24X48 CUFFED NSTRL (MISCELLANEOUS) ×6 IMPLANT
PLUG CATH AND CAP STER (CATHETERS) ×3 IMPLANT
PROTECTOR NERVE ULNAR (MISCELLANEOUS) ×6 IMPLANT
SET CYSTO W/LG BORE CLAMP LF (SET/KITS/TRAYS/PACK) IMPLANT
SET IRRIG TUBING LAPAROSCOPIC (IRRIGATION / IRRIGATOR) ×3 IMPLANT
SOLUTION ELECTROLUBE (MISCELLANEOUS) ×3 IMPLANT
STRIP CLOSURE SKIN 1/4X4 (GAUZE/BANDAGES/DRESSINGS) ×3 IMPLANT
SUT VIC AB 0 CT1 27 (SUTURE) ×3
SUT VIC AB 0 CT1 27XBRD ANTBC (SUTURE) ×6 IMPLANT
SUT VICRYL 0 UR6 27IN ABS (SUTURE) ×6 IMPLANT
SYR 50ML LL SCALE MARK (SYRINGE) ×3 IMPLANT
TIP RUMI ORANGE 6.7MMX12CM (TIP) IMPLANT
TOWEL OR 17X24 6PK STRL BLUE (TOWEL DISPOSABLE) ×9 IMPLANT
TROCAR 12M 150ML BLUNT (TROCAR) ×3 IMPLANT
TROCAR DISP BLADELESS 8 DVNC (TROCAR) IMPLANT
TROCAR DISP BLADELESS 8MM (TROCAR)
TROCAR XCEL 12X100 BLDLESS (ENDOMECHANICALS) ×3 IMPLANT
TROCAR XCEL NON-BLD 5MMX100MML (ENDOMECHANICALS) ×3 IMPLANT
TUBING FILTER THERMOFLATOR (ELECTROSURGICAL) ×3 IMPLANT
WARMER LAPAROSCOPE (MISCELLANEOUS) ×3 IMPLANT
WATER STERILE IRR 1000ML POUR (IV SOLUTION) ×9 IMPLANT

## 2012-08-19 NOTE — Anesthesia Preprocedure Evaluation (Signed)
Anesthesia Evaluation  Patient identified by MRN, date of birth, ID band Patient awake    Reviewed: Allergy & Precautions, H&P , NPO status , Patient's Chart, lab work & pertinent test results, reviewed documented beta blocker date and time   History of Anesthesia Complications Negative for: history of anesthetic complications  Airway Mallampati: I TM Distance: >3 FB Neck ROM: full    Dental  (+) Teeth Intact   Pulmonary neg pulmonary ROS,  breath sounds clear to auscultation  Pulmonary exam normal       Cardiovascular Exercise Tolerance: Good hypertension, On Medications Rhythm:regular Rate:Normal     Neuro/Psych  Headaches (migraines - weekly), negative psych ROS   GI/Hepatic negative GI ROS, Neg liver ROS,   Endo/Other  Morbid obesity  Renal/GU negative Renal ROS  Female GU complaint     Musculoskeletal  (+) Fibromyalgia -  Abdominal   Peds  Hematology  (+) anemia ,   Anesthesia Other Findings   Reproductive/Obstetrics negative OB ROS                           Anesthesia Physical Anesthesia Plan  ASA: III  Anesthesia Plan: General ETT   Post-op Pain Management:    Induction:   Airway Management Planned:   Additional Equipment:   Intra-op Plan:   Post-operative Plan:   Informed Consent: I have reviewed the patients History and Physical, chart, labs and discussed the procedure including the risks, benefits and alternatives for the proposed anesthesia with the patient or authorized representative who has indicated his/her understanding and acceptance.   Dental Advisory Given  Plan Discussed with: CRNA and Surgeon  Anesthesia Plan Comments:         Anesthesia Quick Evaluation

## 2012-08-19 NOTE — Op Note (Signed)
Pre-operative Diagnosis: abnormal uterine bleeding, fibroids and pelvic pain  Post-operative Diagnosis: abnormal uterine bleeding, fibroids, pelvic pain and pelvic adhesions  Operation: Robotic-assisted hysterectomy with bilateral salpingectomy, lysis of adhesions  Surgeon: Roseanna Rainbow  Assistant: Coral Ceo, MD  Anesthesia: GET  Urine Output: per Anesthesia  Findings: Uterus with fibroid involvement.  Adhesions involving the posterior cul-de-sac and bilateral adnexa.  Estimated Blood Loss:  200 mL                 Total IV Fluids: per Anesthesiology         Specimens: PATHOLOGY               Complications:  None; patient tolerated the procedure well.         Disposition: PACU - hemodynamically stable.         Condition: Stable    Procedure Details  The patient was seen in the Holding Room. The risks, benefits, complications, treatment options, and expected outcomes were discussed with the patient.  The patient concurred with the proposed plan, giving informed consent.  The site of surgery properly noted/marked. The patient was identified as Monique [name] and the procedure verified as a Robotic-assisted hysterectomy with bilateral salpingectomy. A Time Out was held and the above information confirmed.  After induction of anesthesia, the patient was draped and prepped in the usual sterile manner. Pt was placed in supine position after anesthesia and draped and prepped in the usual sterile manner. The abdominal drape was placed after the CholoraPrep had been allowed to dry for 3 minutes.  Her arms were tucked to her side with all appropriate precautions.  The shoulder blocks were placed in the usual fashion.  The patient was placed in the semi-lithotomy position in Cofield stirrups.  The perineum was prepped with Betadine.  Foley catheter was placed.  A sterile speculum was placed in the vagina.  The cervix was grasped with a single-tooth tenaculum and dilated with  Shawnie Pons dilators.  The ZUMI uterine manipulator with a medium colpotomizer ring was placed without difficulty.  A pneum occluder balloon was placed over the manipulator.  A second time-out was performed.  OG tube placement was confirmed and to suction.  Approximately 2 cm below the costal margin, in the midclavicular line the skin was anesthestized with 0.25% Marcaine.  A 5 mm incision was made and using a 5 mm Optiview, a 5 mm trocar was placed under direct vision.  The patient's abdomen was insufflated with CO2 gas.  At this point and all points during the procedure, the patient's intra-abdominal pressure did not exceed 15 mmHg.  A 10-12 camera port was place 24 cm above the pubic symphysis.  Bilateral 8 mm ports were place 10 cm and 15 degrees inferior.  All ports were placed under direct visualization.  The 5 mm port was removed.  The incision was extended to accommodate a 10 mm trocar.  A 10 mm trocar and sleeve were advanced under direct visualization.   The robot was docked in the usual fashion.  The round ligament on the right was transected with monopolar cautery.  The anterior and posterior leaves of the broad ligament were opened.  The ureter was identified.  The ovary and distal fallopian tube were mobilized from the broad ligament.  A window was made between the infundibulopelvic ligament and the ureter.  The utero-ovarian ligament and proximal fallopian tube were coagulated with bipolar cautery and transected.  The uterine vessels were skeletonized to the  level of the Koh ring.  The uterine vessels were then coagulated.   A similar procedure was performed on the patient's left side. The round ligament on the left was transected with monopolar cautery.  The anterior and posterior leaves of the broad ligament were opened.    A window was made between the infundibulopelvic ligament and the ureter.  The ovary and distal fallopian tube were mobilized from the broad ligament.  The utero-ovarian ligament and  proximal fallopian tube were coagulated with bipolar cautery and transected.  The uterine vessels were skeletonized to the level of the Koh ring. The right uterine vessels were coagulated with bipolar cautery and transected.   A C-loop was created in the usual fashion.   The bladder flap was completed. The uterine vessels were transected on the right and a C loop was created.   At this time, the pneum occluder balloon was insufflated and a colpotomy was performed.  The uterus and cervix were delivered through the vagina.  The right mesosalpinx was coagulated with bipolar cautery and transected.  The tube was removed from the abdomen.  The left fallopian tube was manipulated in similar fashion.  All pedicles were inspected under low intraabdominal pressures and noted to be hemostatic.  The vagina cuff was closed using 0-Vicryl on a CT-1 needle in a running fashion.  The abdomen and pelvis were copiously irrigated.  The needle was removed without difficulty.  The instruments were then removed under direct visualization and the robotic ports removed.  The robot was undocked.  Deep, subcutaneous, figure-of-eight 0-Vicryl sutures on a UR-6 needle were placed in the 10-12 mm supraumbilical and infracostal incisions.  All skin incisions were closed in a subcuticular fashion using 3-0 Monocryl.  A skin adhesive was then applied.  The vagina was swabbed with minimal bleeding noted.   All instrument and needle counts were correct x  2.

## 2012-08-19 NOTE — Transfer of Care (Signed)
Immediate Anesthesia Transfer of Care Note  Patient: Monique [name]  Procedure(s) Performed: Procedure(s) (LRB) with comments: ROBOTIC ASSISTED TOTAL HYSTERECTOMY (N/A) BILATERAL SALPINGECTOMY (Bilateral)  Patient Location: PACU  Anesthesia Type:General  Level of Consciousness: sedated and patient cooperative  Airway & Oxygen Therapy: Patient Spontanous Breathing and Patient connected to nasal cannula oxygen  Post-op Assessment: Report given to PACU RN and Post -op Vital signs reviewed and stable  Post vital signs: Reviewed and stable  Complications: No apparent anesthesia complications

## 2012-08-19 NOTE — Anesthesia Postprocedure Evaluation (Signed)
  Anesthesia Post-op Note  Patient: Monique [name]  Procedure(s) Performed: Procedure(s) (LRB) with comments: ROBOTIC ASSISTED TOTAL HYSTERECTOMY (N/A) BILATERAL SALPINGECTOMY (Bilateral)  Patient is awake and responsive. Pain and nausea are reasonably well controlled. Vital signs are stable and clinically acceptable. Oxygen saturation is clinically acceptable. There are no apparent anesthetic complications at this time. Patient is ready for discharge.

## 2012-08-20 ENCOUNTER — Encounter (HOSPITAL_COMMUNITY): Payer: Self-pay | Admitting: Obstetrics & Gynecology

## 2012-08-20 LAB — CBC
MCH: 26.9 pg (ref 26.0–34.0)
MCHC: 32.5 g/dL (ref 30.0–36.0)
MCV: 82.7 fL (ref 78.0–100.0)
Platelets: 216 10*3/uL (ref 150–400)
RDW: 13.9 % (ref 11.5–15.5)

## 2012-08-20 LAB — COMPREHENSIVE METABOLIC PANEL
ALT: 10 U/L (ref 0–35)
AST: 15 U/L (ref 0–37)
Albumin: 2.9 g/dL — ABNORMAL LOW (ref 3.5–5.2)
CO2: 28 mEq/L (ref 19–32)
Calcium: 8.1 mg/dL — ABNORMAL LOW (ref 8.4–10.5)
Creatinine, Ser: 0.73 mg/dL (ref 0.50–1.10)
GFR calc non Af Amer: >90 mL/min (ref >90)
Sodium: 135 mEq/L (ref 135–145)
Total Protein: 5.9 g/dL — ABNORMAL LOW (ref 6.0–8.3)

## 2012-08-20 LAB — HEMOGLOBIN AND HEMATOCRIT, BLOOD: HCT: 27.1 % — ABNORMAL LOW (ref 36.0–46.0)

## 2012-08-20 MED ORDER — POTASSIUM CHLORIDE CRYS ER 20 MEQ PO TBCR
20.0000 meq | EXTENDED_RELEASE_TABLET | Freq: Two times a day (BID) | ORAL | Status: DC
  Administered 2012-08-20 – 2012-08-21 (×3): 20 meq via ORAL
  Filled 2012-08-20 (×3): qty 1

## 2012-08-20 NOTE — Anesthesia Postprocedure Evaluation (Signed)
  Anesthesia Post-op Note  Patient: Monique [name]  Procedure(s) Performed: Procedure(s) (LRB) with comments: ROBOTIC ASSISTED TOTAL HYSTERECTOMY (N/A) BILATERAL SALPINGECTOMY (Bilateral)  Patient Location: [facility]  Anesthesia Type:General  Level of Consciousness: awake, alert  and oriented  Airway and Oxygen Therapy: Patient Spontanous Breathing  Post-op Pain: moderate  Post-op Assessment: Patient's Cardiovascular Status Stable, Respiratory Function Stable, Patent Airway, No signs of Nausea or vomiting and Pain level controlled  Post-op Vital Signs: stable  Complications: No apparent anesthesia complications

## 2012-08-20 NOTE — Addendum Note (Signed)
Addendum  created 08/20/12 0757 by Lincoln Brigham, CRNA   Modules edited:Notes Section

## 2012-08-20 NOTE — Progress Notes (Signed)
Patient ID: Monique [name], female   DOB: 10/31/1975, 36 y.o.   MRN: [id number] 1 Day Post-Op Procedure(s) (LRB): ROBOTIC ASSISTED TOTAL HYSTERECTOMY (N/A) BILATERAL SALPINGECTOMY (Bilateral)  Subjective: Patient reports dizziness.    Objective: Vital signs in last 24 hours: Temp:  [98.2 F (36.8 C)-99.5 F (37.5 C)] 98.3 F (36.8 C) (12/27 1000) Pulse Rate:  [89-116] 93  (12/27 1000) Resp:  [16-18] 16  (12/27 1000) BP: (102-126)/(64-81) 102/64 mmHg (12/27 1000) SpO2:  [97 %-100 %] 100 % (12/27 1000) Last BM Date: 08/18/12  Intake/Output from previous day: 12/26 0701 - 12/27 0700 In: 5139.8 [P.O.:1020; I.V.:4119.8] Out: 2875 [Urine:2675; Blood:200] Intake/Output this shift: Total I/O In: -  Out: 100 [Urine:100]  Physical Examination:  General: alert GI: soft, non-tender; bowel sounds normal; no masses,  no organomegaly and incision: clean, dry and intact Extremities: extremities normal, atraumatic, no cyanosis or edema   Labs:  WBC/Hgb/Hct/Plts:  --/8.6/27.1/-- (12/27 1130) BUN/Cr/glu/ALT/AST/amyl/lip:  7/0.73/--/10/15/--/-- (12/27 8295)  Assessment:  36 y.o. s/p Procedure(s): ROBOTIC ASSISTED TOTAL HYSTERECTOMY BILATERAL SALPINGECTOMY: stable  Pain: Pain is well-controlled on oral medications.  Heme:Anemia: hemodynamically stable/ H/H stable  GI:  Tolerating po: Yes    Prophylaxis: intermittent pneumatic compression boots.  Mild hypokalemia  Plan: Advance diet Encourage ambulation Discontinue IV fluids Anticipate D/C home later today; replete K   LOS: 1 day     JACKSON-MOORE,Scarlet Abad A 08/20/2012, 1:41 PM

## 2012-08-21 DIAGNOSIS — D259 Leiomyoma of uterus, unspecified: Secondary | ICD-10-CM | POA: Diagnosis present

## 2012-08-21 MED ORDER — OXYCODONE-ACETAMINOPHEN 5-325 MG PO TABS: 1.0000 | ORAL_TABLET | Freq: Four times a day (QID) | ORAL | Status: DC | PRN

## 2012-08-21 NOTE — Discharge Summary (Signed)
Physician Discharge Summary  Patient ID: Monique [name] MRN: [id number] DOB/AGE: [id number] 37 y.o.  Admit date: 08/19/2012 Discharge date: 08/21/2012  Admission Diagnoses:  Abnormal uterine bleeding, uterine fibroids Discharge Diagnoses: Same, pelvic adhesions   Discharged Condition: good  Hospital Course: On 08/19/2012, the patient underwent the following: Procedure(s): ROBOTIC ASSISTED TOTAL HYSTERECTOMY BILATERAL SALPINGECTOMY.   The postoperative course was remarkable for mild hypokalemia that was corrected orally.  She was discharged to home on postoperative day 2 tolerating a regular diet.  Consults: None  Significant Diagnostic Studies: None  Treatments: surgery: see above  Discharge Exam: Blood pressure 131/89, pulse 94, temperature 98 F (36.7 C), temperature source Oral, resp. rate 18, height 5' 4.5" (1.638 m), weight 90.719 kg (200 lb), SpO2 99.00%. General appearance: alert GI: soft, non-tender; bowel sounds normal; no masses,  no organomegaly Extremities: extremities normal, atraumatic, no cyanosis or edema Incision/Wound:  Incisions C/D/I  Disposition: 01-Home or Self Care  Discharge Orders    Future Orders Please Complete By Expires   Diet - low sodium heart healthy      Increase activity slowly      May walk up steps      May shower / Bathe      Comments:   No tub baths for 6 weeks   Driving Restrictions      Comments:   No driving while taking narcotics   Lifting restrictions      Comments:   No lifting > 5 lbs for 6 weeks   Sexual Activity Restrictions      Comments:   No intercourse for  8 weeks   Discharge wound care:      Comments:   Keep clean and dry   Call MD for:  temperature >100.4      Call MD for:  persistant nausea and vomiting      Call MD for:  severe uncontrolled pain      Call MD for:  redness, tenderness, or signs of infection (pain, swelling, redness, odor or green/yellow discharge around incision site)      Call MD for:   persistant dizziness or light-headedness      Call MD for:  extreme fatigue          Medication List     As of 08/21/2012 11:21 AM    STOP taking these medications         acetaminophen 500 MG tablet   Commonly known as: TYLENOL      norethindrone 5 MG tablet   Commonly known as: AYGESTIN      traMADol 50 MG tablet   Commonly known as: ULTRAM      TAKE these medications         ferrous fumarate 325 (106 FE) MG Tabs   Commonly known as: HEMOCYTE - 106 mg FE   Take 1 tablet by mouth daily.      ibuprofen 200 MG tablet   Commonly known as: ADVIL,MOTRIN   Take 400 mg by mouth every 6 (six) hours as needed. For headache.      oxyCODONE-acetaminophen 5-325 MG per tablet   Commonly known as: PERCOCET/ROXICET   Take 1-2 tablets by mouth every 6 (six) hours as needed (moderate to severe pain (when tolerating fluids)).      potassium chloride 10 MEQ tablet   Commonly known as: K-DUR,KLOR-CON   Take 10 mEq by mouth daily.      pregabalin 75 MG capsule   Commonly known as: LYRICA  Take 75 mg by mouth daily.      TOPAMAX PO   Take 50 mg by mouth daily. For migraine prevention      VERAPAMIL HCL PO   Take 80 mg by mouth daily.           Follow-up Information    Follow up with Antionette Char A, MD. Schedule an appointment as soon as possible for a visit in 2 weeks.   Contact information:   [address], [address] [place] [address] [phone]          Signed: [name] 08/21/2012, 11:21 AM

## 2012-08-21 NOTE — Progress Notes (Signed)
Discharge instructions reviewed with patient.  Activity, medication, return visit and signs/symptoms to report to MD discussed.  No home equipment needed.  Ambulated to car with staff without incident and discharged to home with family.

## 2012-09-03 ENCOUNTER — Inpatient Hospital Stay (HOSPITAL_COMMUNITY)
Admission: AD | Admit: 2012-09-03 | Discharge: 2012-09-04 | Disposition: A | Discharge status: Home / Self Care | Payer: Medicaid Other | Source: Ambulatory Visit | Attending: Obstetrics | Admitting: Obstetrics

## 2012-09-03 DIAGNOSIS — R109 Unspecified abdominal pain: Secondary | ICD-10-CM | POA: Insufficient documentation

## 2012-09-03 DIAGNOSIS — IMO0002 Reserved for concepts with insufficient information to code with codable children: Secondary | ICD-10-CM | POA: Insufficient documentation

## 2012-09-03 LAB — CBC WITH DIFFERENTIAL/PLATELET
Basophils Absolute: 0.1 10*3/uL (ref 0.0–0.1)
Basophils Relative: 1 % (ref 0–1)
Eosinophils Absolute: 0.4 10*3/uL (ref 0.0–0.7)
Eosinophils Relative: 4 % (ref 0–5)
HCT: 29.7 % — ABNORMAL LOW (ref 36.0–46.0)
Hemoglobin: 9.4 g/dL — ABNORMAL LOW (ref 12.0–15.0)
Lymphocytes Relative: 35 % (ref 12–46)
Lymphs Abs: 3.1 10*3/uL (ref 0.7–4.0)
MCH: 26.1 pg (ref 26.0–34.0)
MCHC: 31.6 g/dL (ref 30.0–36.0)
MCV: 82.5 fL (ref 78.0–100.0)
Monocytes Absolute: 1.1 10*3/uL — ABNORMAL HIGH (ref 0.1–1.0)
Monocytes Relative: 12 % (ref 3–12)
Neutro Abs: 4.2 10*3/uL (ref 1.7–7.7)
Neutrophils Relative %: 48 % (ref 43–77)
Platelets: 223 10*3/uL (ref 150–400)
RBC: 3.6 MIL/uL — ABNORMAL LOW (ref 3.87–5.11)
RDW: 14.1 % (ref 11.5–15.5)
WBC: 8.8 10*3/uL (ref 4.0–10.5)

## 2012-09-03 NOTE — MAU Note (Signed)
Had partial hysterectomy 12/26 using Robotics. Have had vaginal bleeding for 2 days. Had MD appt yesterday but had long wait so I left. Having some abdominal pain but not as bad as Weds.

## 2012-09-04 ENCOUNTER — Encounter (HOSPITAL_COMMUNITY): Payer: Self-pay | Admitting: *Deleted

## 2012-09-04 DIAGNOSIS — N926 Irregular menstruation, unspecified: Secondary | ICD-10-CM

## 2012-09-04 DIAGNOSIS — IMO0002 Reserved for concepts with insufficient information to code with codable children: Secondary | ICD-10-CM

## 2012-09-04 LAB — URINE MICROSCOPIC-ADD ON

## 2012-09-04 LAB — URINALYSIS, ROUTINE W REFLEX MICROSCOPIC
Ketones, ur: NEGATIVE mg/dL
Leukocytes, UA: NEGATIVE
Nitrite: NEGATIVE
pH: 6 (ref 5.0–8.0)

## 2012-09-04 NOTE — Progress Notes (Signed)
Bimanual exam done and pt tol well

## 2012-09-04 NOTE — Progress Notes (Signed)
Written and verbal d/c instructions given and understanding voiced. Will use Ibuprofen and Percocet  Pt has at home for pain management

## 2012-09-04 NOTE — MAU Provider Note (Signed)
Chief Complaint: Vaginal Bleeding   First Provider Initiated Contact with Patient 09/04/12 0056     SUBJECTIVE HPI: Monique [name] is a 37 y.o. G2P2002 2 weeks S/P robotic hysterectomy who presents with vaginal bleeding x 3 days that started while going on a gentle walk. Heavy as a period on 09/01/12, lighter since then. Also reports mild sharp low pains on 09/01/12. Minimal pain now. Denies fever, chills, urinary complaints, straining, resuming IC.   Past Medical History  Diagnosis Date  . Chronic headaches   . Skin abnormalities     unnknown skin dx, extremely dry skin  . Alopecia   . Hypertension   . Anemia   . Hemorrhoids   . Abnormal Pap smear   . Fibromyalgia    OB History    Grav Para Term Preterm Abortions TAB SAB Ect Mult Living   2 2 2  0 0 0 0 0 0 2     # Outc Date GA Lbr Len/2nd Wgt Sex Del Anes PTL Lv   1 TRM 5/98           2 TRM 1/01             Past Surgical History  Procedure Date  . Leep 2001 or 2002  . Unknown     "some surgery to remove fibroid cyst that started with an 'E' "  . Leep   . Diagnostic laparoscopy 2009    Rt ovarian cyst removal  . Robotic assisted total hysterectomy 08/19/2012    Procedure: ROBOTIC ASSISTED TOTAL HYSTERECTOMY;  Surgeon: Antionette Char, MD;  Location: [facility];  Service: Gynecology;  Laterality: N/A;  . Bilateral salpingectomy 08/19/2012    Procedure: BILATERAL SALPINGECTOMY;  Surgeon: Antionette Char, MD;  Location: [facility];  Service: Gynecology;  Laterality: Bilateral;  . Abdominal hysterectomy    History   Social History  . Marital Status: Legally Separated    Spouse Name: N/A    Number of Children: N/A  . Years of Education: N/A   Occupational History  . Not on file.   Social History Main Topics  . Smoking status: Never Smoker   . Smokeless tobacco: Never Used  . Alcohol Use: 0.5 oz/week    1 drink(s) per week     Comment: socially  . Drug Use: No  . Sexually Active: Yes    Birth Control/ Protection:  Surgical   Other Topics Concern  . Not on file   Social History Narrative  . No narrative on file   No current facility-administered medications on file prior to encounter.   Current Outpatient Prescriptions on File Prior to Encounter  Medication Sig Dispense Refill  . ferrous fumarate (HEMOCYTE - 106 MG FE) 325 (106 FE) MG TABS Take 1 tablet by mouth daily.      Marland Kitchen ibuprofen (ADVIL,MOTRIN) 200 MG tablet Take 400 mg by mouth every 6 (six) hours as needed. For headache.      . oxyCODONE-acetaminophen (PERCOCET/ROXICET) 5-325 MG per tablet Take 1-2 tablets by mouth every 6 (six) hours as needed (moderate to severe pain (when tolerating fluids)).  30 tablet  0  . pregabalin (LYRICA) 75 MG capsule Take 75 mg by mouth daily.      . Topiramate (TOPAMAX PO) Take 50 mg by mouth daily. For migraine prevention      . VERAPAMIL HCL PO Take 80 mg by mouth daily.        No Known Allergies  ROS: Pertinent items in HPI  OBJECTIVE  Blood pressure 120/79, pulse 84, temperature 98.1 F (36.7 C), temperature source Oral, resp. rate 20, height 5\' 4"  (1.626 m), weight 89.177 kg (196 lb 9.6 oz), last menstrual period 07/30/2012. GENERAL: Well-developed, well-nourished female in no acute distress.  HEENT: Normocephalic HEART: normal rate RESP: normal effort ABDOMEN: Soft, non-tender EXTREMITIES: Nontender, no edema NEURO: Alert and oriented SPECULUM EXAM: NEFG, small amount of pink blood noted w/ two tiny, stringy clots, cervix surgically absent. Cuff pink, smooth, well-approximated edges.  BIMANUAL: no tenderness or masses  LAB RESULTS Results for orders placed during the hospital encounter of 09/03/12 (from the past 24 hour(s))  CBC WITH DIFFERENTIAL     Status: Abnormal   Collection Time   09/03/12 11:41 PM      Component Value Range   WBC 8.8  4.0 - 10.5 K/uL   RBC 3.60 (*) 3.87 - 5.11 MIL/uL   Hemoglobin 9.4 (*) 12.0 - 15.0 g/dL   HCT 16.1 (*) 09.6 - 04.5 %   MCV 82.5  78.0 - 100.0 fL   MCH  26.1  26.0 - 34.0 pg   MCHC 31.6  30.0 - 36.0 g/dL   RDW 40.9  81.1 - 91.4 %   Platelets 223  150 - 400 K/uL   Neutrophils Relative 48  43 - 77 %   Neutro Abs 4.2  1.7 - 7.7 K/uL   Lymphocytes Relative 35  12 - 46 %   Lymphs Abs 3.1  0.7 - 4.0 K/uL   Monocytes Relative 12  3 - 12 %   Monocytes Absolute 1.1 (*) 0.1 - 1.0 K/uL   Eosinophils Relative 4  0 - 5 %   Eosinophils Absolute 0.4  0.0 - 0.7 K/uL   Basophils Relative 1  0 - 1 %   Basophils Absolute 0.1  0.0 - 0.1 K/uL  URINALYSIS, ROUTINE W REFLEX MICROSCOPIC     Status: Abnormal   Collection Time   09/04/12 12:04 AM      Component Value Range   Color, Urine YELLOW  YELLOW   APPearance CLEAR  CLEAR   Specific Gravity, Urine >1.030 (*) 1.005 - 1.030   pH 6.0  5.0 - 8.0   Glucose, UA NEGATIVE  NEGATIVE mg/dL   Hgb urine dipstick LARGE (*) NEGATIVE   Bilirubin Urine NEGATIVE  NEGATIVE   Ketones, ur NEGATIVE  NEGATIVE mg/dL   Protein, ur NEGATIVE  NEGATIVE mg/dL   Urobilinogen, UA 1.0  0.0 - 1.0 mg/dL   Nitrite NEGATIVE  NEGATIVE   Leukocytes, UA NEGATIVE  NEGATIVE  URINE MICROSCOPIC-ADD ON     Status: Abnormal   Collection Time   09/04/12 12:04 AM      Component Value Range   Squamous Epithelial / LPF FEW (*) RARE   WBC, UA 0-2  <3 WBC/hpf   RBC / HPF 7-10  <3 RBC/hpf   Bacteria, UA FEW (*) RARE    IMAGING No results found.  MAU COURSE  ASSESSMENT 1. Postoperative vaginal bleeding   Likely from pulling on stitch from walking, resolving.   PLAN Discharge home Reassurance given.  No going on walks or strenuous activity until OK'd by Dr. Tamela Oddi.      Follow-up Information    Follow up with Roseanna Rainbow, MD. (schedule routine post-operative appointment)    Contact information:   [address], [address] [place] [address] [phone]       Follow up with [facility] [place] [facility]. (As needed if symptoms  worsen)    Contact information:   [address] [id number] mc [place] [address] [phone]          Medication List     As of 09/04/2012  1:36 AM    STOP taking these medications         potassium chloride 10 MEQ tablet   Commonly known as: K-DUR,KLOR-CON      TAKE these medications         ferrous fumarate 325 (106 FE) MG Tabs   Commonly known as: HEMOCYTE - 106 mg FE   Take 1 tablet by mouth daily.      ibuprofen 200 MG tablet   Commonly known as: ADVIL,MOTRIN   Take 400 mg by mouth every 6 (six) hours as needed. For headache.      oxyCODONE-acetaminophen 5-325 MG per tablet   Commonly known as: PERCOCET/ROXICET   Take 1-2 tablets by mouth every 6 (six) hours as needed (moderate to severe pain (when tolerating fluids)).      pregabalin 75 MG capsule   Commonly known as: LYRICA   Take 75 mg by mouth daily.      TOPAMAX PO   Take 50 mg by mouth daily. For migraine prevention      VERAPAMIL HCL PO   Take 80 mg by mouth daily.        [place], CNM 09/04/2012  1:36 AM

## 2013-01-31 ENCOUNTER — Encounter: Payer: Self-pay | Admitting: Obstetrics & Gynecology

## 2013-02-07 ENCOUNTER — Ambulatory Visit (INDEPENDENT_AMBULATORY_CARE_PROVIDER_SITE_OTHER): Payer: Medicaid Other | Admitting: Family

## 2013-02-07 ENCOUNTER — Encounter: Payer: Self-pay | Admitting: Family

## 2013-02-07 VITALS — BP 140/98 | HR 86 | Ht 64.0 in | Wt 189.9 lb

## 2013-02-07 DIAGNOSIS — N760 Acute vaginitis: Secondary | ICD-10-CM

## 2013-02-07 LAB — POCT URINALYSIS DIP (DEVICE)
Glucose, UA: NEGATIVE mg/dL
Leukocytes, UA: NEGATIVE
Nitrite: NEGATIVE
Protein, ur: NEGATIVE mg/dL
Specific Gravity, Urine: 1.025 (ref 1.005–1.030)
Urobilinogen, UA: 1 mg/dL (ref 0.0–1.0)

## 2013-02-07 NOTE — Progress Notes (Signed)
  Subjective:    Patient ID: Monique [name], female    DOB: 01/24/1976, 37 y.o.   MRN: [id number]  HPI Pt is here with report of burning on vagina two weeks ago that lasted for 20 minutes.  No report of lesions or abnormal vaginal discharge.  Denies use of new lubricants or creams.  No new sexual partner.     Review of Systems  Genitourinary: Negative for dysuria, hematuria, vaginal bleeding, vaginal discharge, genital sores, menstrual problem and dyspareunia.       Vaginal irritation       Objective:   Physical Exam  Constitutional: She is oriented to person, place, and time. She appears well-developed and well-nourished. No distress.  HENT:  Head: Normocephalic and atraumatic.  Neck: Normal range of motion. Neck supple. No thyromegaly present.  Abdominal: Bowel sounds are normal.  Genitourinary: Vagina normal. There is no rash, tenderness or lesion on the right labia. There is no rash, tenderness or lesion on the left labia. No bleeding around the vagina. No vaginal discharge found.  Neurological: She is alert and oriented to person, place, and time.  Skin: Skin is warm and dry.          Assessment & Plan:  Normal Vagina  Plan: Provided reassurance Report any lesions or abnormal discharge Reminded that pap smear due October 2014

## 2013-02-07 NOTE — Progress Notes (Signed)
Patient had one episode of pain that started in between her labia and moved up into her clitoris. It lasted for about 20 min. She has not had any episodes since that time.

## 2013-03-04 ENCOUNTER — Ambulatory Visit: Payer: Medicaid Other | Admitting: Advanced Practice Midwife

## 2013-04-12 ENCOUNTER — Other Ambulatory Visit: Payer: Self-pay | Admitting: Ophthalmology

## 2013-04-12 ENCOUNTER — Ambulatory Visit
Admission: RE | Admit: 2013-04-12 | Discharge: 2013-04-12 | Disposition: A | Discharge status: Home / Self Care | Payer: Medicaid Other | Source: Ambulatory Visit | Attending: Ophthalmology | Admitting: Ophthalmology

## 2013-04-12 DIAGNOSIS — H5710 Ocular pain, unspecified eye: Secondary | ICD-10-CM

## 2013-04-12 MED ORDER — IOHEXOL 300 MG/ML  SOLN
75.0000 mL | Freq: Once | INTRAMUSCULAR | Status: AC | PRN
  Administered 2013-04-12: 75 mL via INTRAVENOUS

## 2013-06-30 ENCOUNTER — Other Ambulatory Visit: Payer: Self-pay

## 2013-11-03 ENCOUNTER — Encounter (HOSPITAL_COMMUNITY): Payer: Self-pay | Admitting: Emergency Medicine

## 2013-11-03 DIAGNOSIS — Z872 Personal history of diseases of the skin and subcutaneous tissue: Secondary | ICD-10-CM | POA: Insufficient documentation

## 2013-11-03 DIAGNOSIS — R112 Nausea with vomiting, unspecified: Secondary | ICD-10-CM | POA: Insufficient documentation

## 2013-11-03 DIAGNOSIS — R5381 Other malaise: Secondary | ICD-10-CM | POA: Insufficient documentation

## 2013-11-03 DIAGNOSIS — G8929 Other chronic pain: Secondary | ICD-10-CM | POA: Insufficient documentation

## 2013-11-03 DIAGNOSIS — Z862 Personal history of diseases of the blood and blood-forming organs and certain disorders involving the immune mechanism: Secondary | ICD-10-CM | POA: Insufficient documentation

## 2013-11-03 DIAGNOSIS — R5383 Other fatigue: Secondary | ICD-10-CM

## 2013-11-03 DIAGNOSIS — I10 Essential (primary) hypertension: Secondary | ICD-10-CM | POA: Insufficient documentation

## 2013-11-03 DIAGNOSIS — Z79899 Other long term (current) drug therapy: Secondary | ICD-10-CM | POA: Insufficient documentation

## 2013-11-03 DIAGNOSIS — E860 Dehydration: Secondary | ICD-10-CM | POA: Insufficient documentation

## 2013-11-03 DIAGNOSIS — R000 Tachycardia, unspecified: Secondary | ICD-10-CM | POA: Insufficient documentation

## 2013-11-03 DIAGNOSIS — R197 Diarrhea, unspecified: Secondary | ICD-10-CM | POA: Insufficient documentation

## 2013-11-03 DIAGNOSIS — R52 Pain, unspecified: Secondary | ICD-10-CM | POA: Insufficient documentation

## 2013-11-03 LAB — COMPREHENSIVE METABOLIC PANEL
ALBUMIN: 4.4 g/dL (ref 3.5–5.2)
ALT: 23 U/L (ref 0–35)
AST: 22 U/L (ref 0–37)
Alkaline Phosphatase: 61 U/L (ref 39–117)
BILIRUBIN TOTAL: 0.8 mg/dL (ref 0.3–1.2)
BUN: 12 mg/dL (ref 6–23)
CHLORIDE: 100 meq/L (ref 96–112)
CO2: 24 mEq/L (ref 19–32)
CREATININE: 0.57 mg/dL (ref 0.50–1.10)
Calcium: 9.3 mg/dL (ref 8.4–10.5)
GFR calc non Af Amer: >90 mL/min (ref >90)
GLUCOSE: 107 mg/dL — AB (ref 70–99)
Potassium: 3.5 mEq/L — ABNORMAL LOW (ref 3.7–5.3)
Sodium: 141 mEq/L (ref 137–147)
Total Protein: 8.3 g/dL (ref 6.0–8.3)

## 2013-11-03 LAB — CBC WITH DIFFERENTIAL/PLATELET
BASOS PCT: 0 % (ref 0–1)
Basophils Absolute: 0 10*3/uL (ref 0.0–0.1)
EOS ABS: 0 10*3/uL (ref 0.0–0.7)
Eosinophils Relative: 0 % (ref 0–5)
HEMATOCRIT: 41.3 % (ref 36.0–46.0)
HEMOGLOBIN: 15 g/dL (ref 12.0–15.0)
LYMPHS ABS: 0.7 10*3/uL (ref 0.7–4.0)
Lymphocytes Relative: 6 % — ABNORMAL LOW (ref 12–46)
MCH: 32 pg (ref 26.0–34.0)
MCHC: 36.3 g/dL — AB (ref 30.0–36.0)
MCV: 88.1 fL (ref 78.0–100.0)
MONO ABS: 0.6 10*3/uL (ref 0.1–1.0)
MONOS PCT: 5 % (ref 3–12)
NEUTROS ABS: 10.2 10*3/uL — AB (ref 1.7–7.7)
Neutrophils Relative %: 89 % — ABNORMAL HIGH (ref 43–77)
Platelets: 213 10*3/uL (ref 150–400)
RBC: 4.69 MIL/uL (ref 3.87–5.11)
RDW: 12.2 % (ref 11.5–15.5)
WBC: 11.5 10*3/uL — ABNORMAL HIGH (ref 4.0–10.5)

## 2013-11-03 LAB — URINALYSIS, ROUTINE W REFLEX MICROSCOPIC
GLUCOSE, UA: NEGATIVE mg/dL
Hgb urine dipstick: NEGATIVE
KETONES UR: NEGATIVE mg/dL
NITRITE: NEGATIVE
PROTEIN: NEGATIVE mg/dL
Specific Gravity, Urine: 1.031 — ABNORMAL HIGH (ref 1.005–1.030)
UROBILINOGEN UA: 0.2 mg/dL (ref 0.0–1.0)
pH: 5.5 (ref 5.0–8.0)

## 2013-11-03 LAB — URINE MICROSCOPIC-ADD ON

## 2013-11-03 NOTE — ED Notes (Signed)
Pt. reports emesis , diarrhea, generalized body aches and chills onset this morning .

## 2013-11-04 ENCOUNTER — Emergency Department (HOSPITAL_COMMUNITY)
Admission: EM | Admit: 2013-11-04 | Discharge: 2013-11-04 | Disposition: A | Discharge status: Home / Self Care | Payer: Medicaid Other | Attending: Emergency Medicine | Admitting: Emergency Medicine

## 2013-11-04 DIAGNOSIS — R197 Diarrhea, unspecified: Secondary | ICD-10-CM

## 2013-11-04 DIAGNOSIS — E860 Dehydration: Secondary | ICD-10-CM

## 2013-11-04 DIAGNOSIS — R1110 Vomiting, unspecified: Secondary | ICD-10-CM

## 2013-11-04 MED ORDER — ONDANSETRON HCL 4 MG PO TABS: 4.0000 mg | ORAL_TABLET | Freq: Four times a day (QID) | ORAL | Status: DC

## 2013-11-04 MED ORDER — ACETAMINOPHEN 325 MG PO TABS
650.0000 mg | ORAL_TABLET | Freq: Once | ORAL | Status: AC
  Administered 2013-11-04: 650 mg via ORAL
  Filled 2013-11-04: qty 2

## 2013-11-04 MED ORDER — SODIUM CHLORIDE 0.9 % IV BOLUS (SEPSIS)
2000.0000 mL | Freq: Once | INTRAVENOUS | Status: AC
  Administered 2013-11-04: 2000 mL via INTRAVENOUS

## 2013-11-04 MED ORDER — ONDANSETRON HCL 4 MG/2ML IJ SOLN
4.0000 mg | Freq: Once | INTRAMUSCULAR | Status: AC
  Administered 2013-11-04: 4 mg via INTRAVENOUS
  Filled 2013-11-04: qty 2

## 2013-11-04 NOTE — ED Provider Notes (Signed)
CSN: [id number]     Arrival date & time 11/03/13  2006 History   First MD Initiated Contact with Patient 11/04/13 0122     Chief Complaint  Patient presents with  . Emesis  . Diarrhea  . Generalized Body Aches  . Chills     (Consider location/radiation/quality/duration/timing/severity/associated sxs/prior Treatment) HPI Patient is a 38 yo woman who presents with nearly 24 hrs of nausea, vomiting and diarrhea. Patient has had about 7 to 8 episodes of NBNB emesis and the same number of non bloody watery brown stools. She denies abdominal pain. Her po intake has been diminished. Says she feels generally weak. No fever. Called her PCP's office and was advised to come to ED to receive IVF.  No known ill contacts.     Past Medical History  Diagnosis Date  . Chronic headaches   . Skin abnormalities     unnknown skin dx, extremely dry skin  . Alopecia   . Hypertension   . Anemia   . Hemorrhoids   . Abnormal Pap smear   . Fibromyalgia    Past Surgical History  Procedure Laterality Date  . Leep  2001 or 2002  . Unknown      "some surgery to remove fibroid cyst that started with an 'E' "  . Leep    . Diagnostic laparoscopy  2009    Rt ovarian cyst removal  . Robotic assisted total hysterectomy  08/19/2012    Procedure: ROBOTIC ASSISTED TOTAL HYSTERECTOMY;  Surgeon: Lahoma Crocker, MD;  Location: [facility];  Service: Gynecology;  Laterality: N/A;  . Bilateral salpingectomy  08/19/2012    Procedure: BILATERAL SALPINGECTOMY;  Surgeon: Lahoma Crocker, MD;  Location: [facility];  Service: Gynecology;  Laterality: Bilateral;  . Abdominal hysterectomy     Family History  Problem Relation Age of Onset  . Cancer Mother     cervical  . Arthritis Mother   . Hypertension Mother   . Cancer Brother   . Hypertension Brother   . Heart disease Father   . Hypertension Father   . Diabetes Sister   . Heart disease Sister   . Hypertension Sister    History  Substance Use Topics  .  Smoking status: Never Smoker   . Smokeless tobacco: Never Used  . Alcohol Use: 0.5 oz/week    1 drink(s) per week     Comment: socially   OB History   Grav Para Term Preterm Abortions TAB SAB Ect Mult Living   2 2 2  0 0 0 0 0 0 2     Review of Systems Ten point review of symptoms performed and is negative with the exception of symptoms noted above.     Allergies  Review of patient's allergies indicates no known allergies.  Home Medications   Current Outpatient Rx  Name  Route  Sig  Dispense  Refill  . amLODipine-benazepril (LOTREL) 5-10 MG per capsule   Oral   Take 1 capsule by mouth daily.         Marland Kitchen ibuprofen (ADVIL,MOTRIN) 200 MG tablet   Oral   Take 400 mg by mouth every 6 (six) hours as needed. For headache.         . methocarbamol (ROBAXIN) 750 MG tablet   Oral   Take 750 mg by mouth 2 (two) times daily.         Marland Kitchen oxyCODONE-acetaminophen (PERCOCET/ROXICET) 5-325 MG per tablet   Oral   Take 1-2 tablets by mouth every 6 (six)  hours as needed (moderate to severe pain (when tolerating fluids)).   30 tablet   0   . pregabalin (LYRICA) 75 MG capsule   Oral   Take 75 mg by mouth daily.          BP 131/84  Pulse 98  Temp(Src) 99.8 F (37.7 C) (Oral)  Resp 16  Ht 5' 4.5" (1.638 m)  Wt 204 lb (92.534 kg)  BMI 34.49 kg/m2  SpO2 98%  LMP 07/30/2012 Physical Exam Gen: well developed and well nourished appearing Head: NCAT, alopecia noted Eyes: PERL, EOMI Nose: no epistaixis or rhinorrhea Mouth/throat: mucosa is moist and pink Neck: supple, no stridor Lungs: CTA B, no wheezing, rhonchi or rales CV: rapid an regular, no murmur, extremities appear well perfused.  Abd: soft, notender, nondistended Back: no ttp, no cva ttp Skin: warm and dry Ext: normal to inspection, no dependent edema Neuro: CN ii-xii grossly intact, no focal deficits Psyche; normal affect,  calm and cooperative.   ED Course  Procedures (including critical care time) Labs  Review  Results for orders placed during the hospital encounter of 11/04/13 (from the past 24 hour(s))  CBC WITH DIFFERENTIAL     Status: Abnormal   Collection Time    11/03/13  8:19 PM      Result Value Ref Range   WBC 11.5 (*) 4.0 - 10.5 K/uL   RBC 4.69  3.87 - 5.11 MIL/uL   Hemoglobin 15.0  12.0 - 15.0 g/dL   HCT 41.3  36.0 - 46.0 %   MCV 88.1  78.0 - 100.0 fL   MCH 32.0  26.0 - 34.0 pg   MCHC 36.3 (*) 30.0 - 36.0 g/dL   RDW 12.2  11.5 - 15.5 %   Platelets 213  150 - 400 K/uL   Neutrophils Relative % 89 (*) 43 - 77 %   Neutro Abs 10.2 (*) 1.7 - 7.7 K/uL   Lymphocytes Relative 6 (*) 12 - 46 %   Lymphs Abs 0.7  0.7 - 4.0 K/uL   Monocytes Relative 5  3 - 12 %   Monocytes Absolute 0.6  0.1 - 1.0 K/uL   Eosinophils Relative 0  0 - 5 %   Eosinophils Absolute 0.0  0.0 - 0.7 K/uL   Basophils Relative 0  0 - 1 %   Basophils Absolute 0.0  0.0 - 0.1 K/uL  COMPREHENSIVE METABOLIC PANEL     Status: Abnormal   Collection Time    11/03/13  8:19 PM      Result Value Ref Range   Sodium 141  137 - 147 mEq/L   Potassium 3.5 (*) 3.7 - 5.3 mEq/L   Chloride 100  96 - 112 mEq/L   CO2 24  19 - 32 mEq/L   Glucose, Bld 107 (*) 70 - 99 mg/dL   BUN 12  6 - 23 mg/dL   Creatinine, Ser 0.57  0.50 - 1.10 mg/dL   Calcium 9.3  8.4 - 10.5 mg/dL   Total Protein 8.3  6.0 - 8.3 g/dL   Albumin 4.4  3.5 - 5.2 g/dL   AST 22  0 - 37 U/L   ALT 23  0 - 35 U/L   Alkaline Phosphatase 61  39 - 117 U/L   Total Bilirubin 0.8  0.3 - 1.2 mg/dL   GFR calc non Af Amer >90  >90 mL/min   GFR calc Af Amer >90  >90 mL/min  URINALYSIS, ROUTINE W REFLEX MICROSCOPIC  Status: Abnormal   Collection Time    11/03/13  8:30 PM      Result Value Ref Range   Color, Urine YELLOW  YELLOW   APPearance CLOUDY (*) CLEAR   Specific Gravity, Urine 1.031 (*) 1.005 - 1.030   pH 5.5  5.0 - 8.0   Glucose, UA NEGATIVE  NEGATIVE mg/dL   Hgb urine dipstick NEGATIVE  NEGATIVE   Bilirubin Urine SMALL (*) NEGATIVE   Ketones, ur NEGATIVE   NEGATIVE mg/dL   Protein, ur NEGATIVE  NEGATIVE mg/dL   Urobilinogen, UA 0.2  0.0 - 1.0 mg/dL   Nitrite NEGATIVE  NEGATIVE   Leukocytes, UA MODERATE (*) NEGATIVE  URINE MICROSCOPIC-ADD ON     Status: Abnormal   Collection Time    11/03/13  8:30 PM      Result Value Ref Range   Squamous Epithelial / LPF MANY (*) RARE   WBC, UA 3-6  <3 WBC/hpf   RBC / HPF 0-2  <3 RBC/hpf   Bacteria, UA FEW (*) RARE   Urine-Other MUCOUS PRESENT     MDM   Patient with vomiting, diarrhea and tachycardia. Benign abdominal exam. Labs are reassuring. The patient is feeling better after Tx with IVF and antiemetic. She is tolerating po intake. Suspect viral gastroenteritis, which is currently epidemic in this community, as cause of the patient's sx. She is stable for d/c with plan to f/u with PCP in the next 1-2 days along with return precautions.     Elyn Peers, MD 11/04/13 [phone]

## 2013-11-04 NOTE — ED Notes (Signed)
Patient is ambulatory. Vitals stable. No nausea or pain at discharge.

## 2013-11-04 NOTE — ED Notes (Signed)
Patient given PO fluids

## 2014-06-26 ENCOUNTER — Encounter (HOSPITAL_COMMUNITY): Payer: Self-pay | Admitting: Emergency Medicine

## 2014-06-30 ENCOUNTER — Ambulatory Visit: Payer: Medicaid Other | Admitting: Obstetrics & Gynecology

## 2014-08-10 ENCOUNTER — Telehealth: Payer: Self-pay | Admitting: *Deleted

## 2014-08-10 NOTE — Telephone Encounter (Signed)
Pt placed call to office, ask for return call. Return call placed to pt.  No answer, left message for pt to contact office.

## 2014-08-21 ENCOUNTER — Encounter: Payer: Self-pay | Admitting: *Deleted

## 2014-08-22 ENCOUNTER — Encounter: Payer: Self-pay | Admitting: Obstetrics & Gynecology

## 2014-09-13 NOTE — Telephone Encounter (Signed)
Patient has not contacted the office. Re-file call.

## 2015-06-21 ENCOUNTER — Encounter (HOSPITAL_COMMUNITY): Payer: Self-pay | Admitting: Emergency Medicine

## 2015-06-21 ENCOUNTER — Emergency Department (HOSPITAL_COMMUNITY)
Admission: EM | Admit: 2015-06-21 | Discharge: 2015-06-22 | Disposition: A | Discharge status: Home / Self Care | Payer: Medicaid Other | Attending: Physician Assistant | Admitting: Physician Assistant

## 2015-06-21 ENCOUNTER — Emergency Department (HOSPITAL_COMMUNITY): Payer: Medicaid Other

## 2015-06-21 DIAGNOSIS — I10 Essential (primary) hypertension: Secondary | ICD-10-CM | POA: Diagnosis not present

## 2015-06-21 DIAGNOSIS — G8929 Other chronic pain: Secondary | ICD-10-CM | POA: Diagnosis not present

## 2015-06-21 DIAGNOSIS — Z872 Personal history of diseases of the skin and subcutaneous tissue: Secondary | ICD-10-CM | POA: Diagnosis not present

## 2015-06-21 DIAGNOSIS — Z79899 Other long term (current) drug therapy: Secondary | ICD-10-CM | POA: Insufficient documentation

## 2015-06-21 DIAGNOSIS — R079 Chest pain, unspecified: Secondary | ICD-10-CM

## 2015-06-21 DIAGNOSIS — Z8719 Personal history of other diseases of the digestive system: Secondary | ICD-10-CM | POA: Insufficient documentation

## 2015-06-21 DIAGNOSIS — Z862 Personal history of diseases of the blood and blood-forming organs and certain disorders involving the immune mechanism: Secondary | ICD-10-CM | POA: Diagnosis not present

## 2015-06-21 LAB — BASIC METABOLIC PANEL
ANION GAP: 4 — AB (ref 5–15)
BUN: 15 mg/dL (ref 6–20)
CHLORIDE: 106 mmol/L (ref 101–111)
CO2: 26 mmol/L (ref 22–32)
Calcium: 8.5 mg/dL — ABNORMAL LOW (ref 8.9–10.3)
Creatinine, Ser: 1.06 mg/dL — ABNORMAL HIGH (ref 0.44–1.00)
GFR calc Af Amer: >60 mL/min (ref >60)
GFR calc non Af Amer: >60 mL/min (ref >60)
Glucose, Bld: 111 mg/dL — ABNORMAL HIGH (ref 65–99)
POTASSIUM: 3.7 mmol/L (ref 3.5–5.1)
SODIUM: 136 mmol/L (ref 135–145)

## 2015-06-21 LAB — CBC
HEMATOCRIT: 37.3 % (ref 36.0–46.0)
HEMOGLOBIN: 12.4 g/dL (ref 12.0–15.0)
MCH: 30.3 pg (ref 26.0–34.0)
MCHC: 33.2 g/dL (ref 30.0–36.0)
MCV: 91.2 fL (ref 78.0–100.0)
Platelets: 160 10*3/uL (ref 150–400)
RBC: 4.09 MIL/uL (ref 3.87–5.11)
RDW: 12.5 % (ref 11.5–15.5)
WBC: 8.2 10*3/uL (ref 4.0–10.5)

## 2015-06-21 NOTE — ED Notes (Signed)
Pt states that she has been tired and has had CP x 4 days. Alert and oriented.

## 2015-06-21 NOTE — ED Provider Notes (Signed)
CSN: [id number]     Arrival date & time 06/21/15  2203 History  By signing my name below, I, Monique [name], attest that this documentation has been prepared under the direction and in the presence of Elwin Tsou Julio Alm, MD . Electronically Signed: Evelene [name], Scribe. 06/22/2015. 12:45 AM.    Chief Complaint  Patient presents with  . Chest Pain   The history is provided by the patient. No language interpreter was used.    HPI Comments:  Monique [name] is a 39 y.o. female with a history of HTN, who presents to the Emergency Department complaining of throbbing CP which began 4 days ago, constant today. She reports associated tingling in left fingers. Pt denies SOB, diaphoresis and notes pain is not exacerbated by exertion. No alleviating factors noted; no treatments tried. No use of BCP. Pt notes long bus ride to [place], [place] a few weeks ago.    Past Medical History  Diagnosis Date  . Chronic headaches   . Skin abnormalities     unnknown skin dx, extremely dry skin  . Alopecia   . Hypertension   . Anemia   . Hemorrhoids   . Abnormal Pap smear   . Fibromyalgia    Past Surgical History  Procedure Laterality Date  . Leep  2001 or 2002  . Unknown      "some surgery to remove fibroid cyst that started with an 'E' "  . Leep    . Diagnostic laparoscopy  2009    Rt ovarian cyst removal  . Robotic assisted total hysterectomy  08/19/2012    Procedure: ROBOTIC ASSISTED TOTAL HYSTERECTOMY;  Surgeon: Lahoma Crocker, MD;  Location: [facility];  Service: Gynecology;  Laterality: N/A;  . Bilateral salpingectomy  08/19/2012    Procedure: BILATERAL SALPINGECTOMY;  Surgeon: Lahoma Crocker, MD;  Location: [facility];  Service: Gynecology;  Laterality: Bilateral;  . Abdominal hysterectomy     Family History  Problem Relation Age of Onset  . Cancer Mother     cervical  . Arthritis Mother   . Hypertension Mother   . Cancer Brother   . Hypertension Brother   . Heart disease  Father   . Hypertension Father   . Diabetes Sister   . Heart disease Sister   . Hypertension Sister    Social History  Substance Use Topics  . Smoking status: Never Smoker   . Smokeless tobacco: Never Used  . Alcohol Use: 0.5 oz/week    1 drink(s) per week     Comment: socially   OB History    Gravida Para Term Preterm AB TAB SAB Ectopic Multiple Living   2 2 2  0 0 0 0 0 0 2     Review of Systems  Constitutional: Negative for fever and diaphoresis.  Respiratory: Negative for shortness of breath.   Cardiovascular: Positive for chest pain.  All other systems reviewed and are negative.   Allergies  Review of patient's allergies indicates no known allergies.  Home Medications   Prior to Admission medications   Medication Sig Start Date End Date Taking? Authorizing Provider  amLODipine-benazepril (LOTREL) 5-10 MG per capsule Take 1 capsule by mouth daily.   Yes Historical Provider, MD  cyclobenzaprine (FLEXERIL) 10 MG tablet Take 10 mg by mouth 2 (two) times daily.   Yes Historical Provider, MD  DULoxetine (CYMBALTA) 60 MG capsule Take 1 capsule by mouth daily. 05/22/15  Yes Historical Provider, MD  ibuprofen (ADVIL,MOTRIN) 200 MG tablet Take 400 mg by  mouth every 6 (six) hours as needed. For headache.   Yes Historical Provider, MD  pregabalin (LYRICA) 150 MG capsule Take 150 mg by mouth 3 (three) times daily.   Yes Historical Provider, MD  ondansetron (ZOFRAN) 4 MG tablet Take 1 tablet (4 mg total) by mouth every 6 (six) hours. Patient not taking: Reported on 06/21/2015 11/04/13   Elyn Peers, MD  oxyCODONE-acetaminophen (PERCOCET/ROXICET) 5-325 MG per tablet Take 1-2 tablets by mouth every 6 (six) hours as needed (moderate to severe pain (when tolerating fluids)). Patient not taking: Reported on 06/21/2015 08/21/12   Lahoma Crocker, MD   BP 131/88 mmHg  Pulse 80  Temp(Src) 98.2 F (36.8 C) (Oral)  Resp 18  Ht 5\' 4"  (1.626 m)  Wt 198 lb (89.812 kg)  BMI 33.97 kg/m2   LMP 07/30/2012 Physical Exam  Constitutional: She is oriented to person, place, and time. She appears well-developed and well-nourished. No distress.  HENT:  Head: Normocephalic and atraumatic.  Eyes: Conjunctivae are normal.  Cardiovascular: Normal rate, regular rhythm and normal heart sounds.   Pulmonary/Chest: Effort normal and breath sounds normal. No respiratory distress.  Abdominal: Soft. She exhibits no distension. There is no tenderness.  Neurological: She is alert and oriented to person, place, and time.  Skin: Skin is warm and dry.  Psychiatric: She has a normal mood and affect.  Nursing note and vitals reviewed.   ED Course  Procedures   DIAGNOSTIC STUDIES:  Oxygen Saturation is 98% on RA, normal by my interpretation.    COORDINATION OF CARE:  12:04 AM Will order CXR and pain meds. Discussed treatment plan with pt at bedside and pt agreed to plan.  Labs Review Labs Reviewed  BASIC METABOLIC PANEL - Abnormal; Notable for the following:    Glucose, Bld 111 (*)    Creatinine, Ser 1.06 (*)    Calcium 8.5 (*)    Anion gap 4 (*)    All other components within normal limits  CBC  I-STAT TROPOININ, ED    Imaging Review Dg Chest 2 View  06/21/2015  CLINICAL DATA:  Mid upper chest pain for 3 days. EXAM: CHEST  2 VIEW COMPARISON:  08/26/2011 FINDINGS: The heart size and mediastinal contours are within normal limits. Both lungs are clear. The visualized skeletal structures are unremarkable. IMPRESSION: No active cardiopulmonary disease. Electronically Signed   By: Markus Daft M.D.   On: 06/21/2015 22:36   I have personally reviewed and evaluated these images and lab results as part of my medical decision-making.   EKG Interpretation   Date/Time:  Thursday June 21 2015 22:13:58 EDT Ventricular Rate:  83 PR Interval:  141 QRS Duration: 78 QT Interval:  366 QTC Calculation: 430 R Axis:   30 Text Interpretation:  Sinus rhythm no acute ischemia. No right axis No   significant change since last tracing Confirmed by Gerald Leitz  [id number]) on 06/22/2015 12:31:46 AM      MDM   Final diagnoses:  None    Patient is a 39 year old healthy female with history of hypertension fibromyalgia and chronic pain presenting today with chest pain. She reports and going over the last 3-4 days. She reports is constant. Not pleuritic. No shortness of breath. Not exacerbated by exertion. Sounds very atypical.  We will get chest x-ray, initial troponin, EKG.  EKG non-ischemic. Vital signs are stable and physical exam is normal.  Do not suspect pulmonary embolism to because patient has very few risk factors, no shortness breath no tachypnea and  no tachycardia.  I personally performed the services described in this documentation, which was scribed in my presence. The recorded information has been reviewed and is accurate.    1:21 AM Labs normal. Patient ambulatory, at baseline.  Will dc home with follow up.  Arafat Cocuzza Julio Alm, MD 06/22/15 [id number]

## 2015-06-22 LAB — I-STAT TROPONIN, ED: TROPONIN I, POC: 0 ng/mL (ref 0.00–0.08)

## 2015-06-22 MED ORDER — OXYCODONE-ACETAMINOPHEN 5-325 MG PO TABS
1.0000 | ORAL_TABLET | Freq: Once | ORAL | Status: AC
  Administered 2015-06-22: 1 via ORAL
  Filled 2015-06-22: qty 1

## 2015-06-22 NOTE — Discharge Instructions (Signed)
Please return with any concerning symptoms including shortness of breath or fever.

## 2015-06-22 NOTE — ED Notes (Signed)
MD at bedside. 

## 2017-03-01 ENCOUNTER — Encounter (HOSPITAL_COMMUNITY): Payer: Self-pay

## 2017-03-01 ENCOUNTER — Inpatient Hospital Stay (HOSPITAL_COMMUNITY)
Admission: AD | Admit: 2017-03-01 | Discharge: 2017-03-01 | Disposition: A | Discharge status: Home Health | Payer: Medicaid Other | Source: Ambulatory Visit | Attending: Obstetrics & Gynecology | Admitting: Obstetrics & Gynecology

## 2017-03-01 DIAGNOSIS — N898 Other specified noninflammatory disorders of vagina: Secondary | ICD-10-CM | POA: Diagnosis present

## 2017-03-01 DIAGNOSIS — B9689 Other specified bacterial agents as the cause of diseases classified elsewhere: Secondary | ICD-10-CM

## 2017-03-01 DIAGNOSIS — N760 Acute vaginitis: Secondary | ICD-10-CM

## 2017-03-01 LAB — URINALYSIS, ROUTINE W REFLEX MICROSCOPIC
BILIRUBIN URINE: NEGATIVE
Glucose, UA: NEGATIVE mg/dL
HGB URINE DIPSTICK: NEGATIVE
KETONES UR: NEGATIVE mg/dL
NITRITE: NEGATIVE
PROTEIN: NEGATIVE mg/dL
SPECIFIC GRAVITY, URINE: 1.027 (ref 1.005–1.030)
pH: 5 (ref 5.0–8.0)

## 2017-03-01 LAB — HEPATITIS B SURFACE ANTIGEN: Hepatitis B Surface Ag: NEGATIVE

## 2017-03-01 LAB — RAPID HIV SCREEN (HIV 1/2 AB+AG)
HIV 1/2 Antibodies: NONREACTIVE
HIV-1 P24 Antigen - HIV24: NONREACTIVE

## 2017-03-01 LAB — RPR: RPR Ser Ql: NONREACTIVE

## 2017-03-01 MED ORDER — METRONIDAZOLE 0.75 % VA GEL: 1.0000 | Freq: Two times a day (BID) | VAGINAL | 1 refills | Status: DC

## 2017-03-01 NOTE — Discharge Instructions (Signed)

## 2017-03-01 NOTE — MAU Provider Note (Signed)
History   41 yo BF in with c/o vaginal discharge for several week duration was seen by private provider and treated for BV and has finished meds.. 2 days ago discharge got worse and is burning and itching. Request STD screening.  CSN: [id number]  Arrival date & time 03/01/17  1049   None     Chief Complaint  Patient presents with  . Vaginal Discharge    green discharge 2 days, treated for bacterial infection a week ago     HPI  Past Medical History:  Diagnosis Date  . Abnormal Pap smear   . Alopecia   . Anemia   . Chronic headaches   . Fibromyalgia   . Hemorrhoids   . Hypertension   . Skin abnormalities    unnknown skin dx, extremely dry skin    Past Surgical History:  Procedure Laterality Date  . ABDOMINAL HYSTERECTOMY    . BILATERAL SALPINGECTOMY  08/19/2012   Procedure: BILATERAL SALPINGECTOMY;  Surgeon: Lahoma Crocker, MD;  Location: [facility];  Service: Gynecology;  Laterality: Bilateral;  . DIAGNOSTIC LAPAROSCOPY  2009   Rt ovarian cyst removal  . LEEP  2001 or 2002  . LEEP    . ROBOTIC ASSISTED TOTAL HYSTERECTOMY  08/19/2012   Procedure: ROBOTIC ASSISTED TOTAL HYSTERECTOMY;  Surgeon: Lahoma Crocker, MD;  Location: [facility];  Service: Gynecology;  Laterality: N/A;  . unknown     "some surgery to remove fibroid cyst that started with an 'E' "    Family History  Problem Relation Age of Onset  . Cancer Mother        cervical  . Arthritis Mother   . Hypertension Mother   . Cancer Brother   . Hypertension Brother   . Heart disease Father   . Hypertension Father   . Diabetes Sister   . Heart disease Sister   . Hypertension Sister     Social History  Substance Use Topics  . Smoking status: Never Smoker  . Smokeless tobacco: Never Used  . Alcohol use 0.5 oz/week    1 drink(s) per week     Comment: socially    OB History    Gravida Para Term Preterm AB Living   2 2 2  0 0 2   SAB TAB Ectopic Multiple Live Births   0 0 0 0        Review of  Systems  Constitutional: Negative.   HENT: Negative.   Eyes: Negative.   Respiratory: Negative.   Cardiovascular: Negative.   Gastrointestinal: Negative.   Endocrine: Negative.   Genitourinary: Positive for vaginal discharge and vaginal pain.  Musculoskeletal: Negative.   Skin: Negative.   Allergic/Immunologic: Negative.   Neurological: Negative.   Hematological: Negative.   Psychiatric/Behavioral: Negative.     Allergies  Patient has no known allergies.  Home Medications    BP 114/74 (BP Location: Left Arm)   Pulse 89   Temp 98 F (36.7 C) (Oral)   Resp 18   LMP 07/30/2012   Physical Exam  Constitutional: She is oriented to person, place, and time. She appears well-developed and well-nourished.  HENT:  Head: Normocephalic.  Eyes: Pupils are equal, round, and reactive to light.  Neck: Normal range of motion.  Cardiovascular: Normal rate, regular rhythm, normal heart sounds and intact distal pulses.   Pulmonary/Chest: Effort normal and breath sounds normal.  Abdominal: Soft. Bowel sounds are normal.  Genitourinary: Vaginal discharge found.  Musculoskeletal: Normal range of motion.  Neurological: She is alert  and oriented to person, place, and time. She has normal reflexes.  Skin: Skin is warm and dry.  Psychiatric: She has a normal mood and affect. Her behavior is normal. Judgment and thought content normal.    MAU Course  Procedures (including critical care time)  Labs Reviewed  URINALYSIS, ROUTINE W REFLEX MICROSCOPIC - Abnormal; Notable for the following:       Result Value   APPearance HAZY (*)    Leukocytes, UA LARGE (*)    Bacteria, UA RARE (*)    Squamous Epithelial / LPF 6-30 (*)    All other components within normal limits  WET PREP, GENITAL  RAPID HIV SCREEN (HIV 1/2 AB+AG)  RPR  HEPATITIS B SURFACE ANTIGEN  HEPATITIS PANEL, ACUTE  GC/CHLAMYDIA PROBE AMP ([place]) NOT AT [facility]   No results found.   No diagnosis found.    MDM  Wet  preg pos clue and TNTC wbc's, no yeast. Exam revels inner labias and introitus red, inflammed and greenish yellow discharge present. Cultures obtained. Since pt failed oral flagyl therapy will treat with metrogel and d/c home.

## 2017-03-01 NOTE — MAU Note (Signed)
Patient reports having a green discharge for 2 days. Went to provider, Carola Rhine, PA on Tuesday June 26th for bacterial infection and was given a prescription for an antibiotic, She states that she took the medication for 7 days and it did not work. She was then told to take over the counter Monistat by Carola Rhine, PA for yeast infection and she reports it felt like she was on fire when she tried to use it.

## 2017-03-02 LAB — GC/CHLAMYDIA PROBE AMP (~~LOC~~) NOT AT ARMC
CHLAMYDIA, DNA PROBE: NEGATIVE
NEISSERIA GONORRHEA: NEGATIVE

## 2017-03-03 LAB — HEPATITIS PANEL, ACUTE
HEP B C IGM: NEGATIVE
HEP B S AG: NEGATIVE
Hep A IgM: NEGATIVE

## 2017-08-04 ENCOUNTER — Ambulatory Visit: Payer: BLUE CROSS/BLUE SHIELD | Admitting: Podiatry

## 2017-08-19 ENCOUNTER — Encounter: Payer: Self-pay | Admitting: Podiatry

## 2017-08-19 ENCOUNTER — Ambulatory Visit: Payer: BLUE CROSS/BLUE SHIELD | Admitting: Podiatry

## 2017-08-19 DIAGNOSIS — Z79899 Other long term (current) drug therapy: Secondary | ICD-10-CM | POA: Diagnosis not present

## 2017-08-19 DIAGNOSIS — B351 Tinea unguium: Secondary | ICD-10-CM | POA: Diagnosis not present

## 2017-08-19 NOTE — Progress Notes (Signed)
   Subjective:    Patient ID: Monique [name], female    DOB: November 14, 1975, 41 y.o.   MRN: [id number]  HPI    Review of Systems     Objective:   Physical Exam        Assessment & Plan:

## 2017-08-26 NOTE — Progress Notes (Signed)
   Subjective: Patient presents today for possible treatment of fungal nails of bilateral great toes that has been present for the past year.  She reports associated discoloration and thickening of the nails, left worse than right.  She states the symptoms are worsening and reports intermittent pain.  She has been applying Vicks vapor rub with no significant relief.  Patient presents today for further treatment and evaluation.   Past Medical History:  Diagnosis Date  . Abnormal Pap smear   . Alopecia   . Anemia   . Chronic headaches   . Fibromyalgia   . Hemorrhoids   . Hypertension   . Skin abnormalities    unnknown skin dx, extremely dry skin    Objective: Physical Exam General: The patient is alert and oriented x3 in no acute distress.  Dermatology: Hyperkeratotic, discolored, thickened, onychodystrophy of bilateral great toenails. Skin is warm, dry and supple bilateral lower extremities. Negative for open lesions or macerations.  Vascular: Palpable pedal pulses bilaterally. No edema or erythema noted. Capillary refill within normal limits.  Neurological: Epicritic and protective threshold grossly intact bilaterally.   Musculoskeletal Exam: Range of motion within normal limits to all pedal and ankle joints bilateral. Muscle strength 5/5 in all groups bilateral.   Assessment: #1  Onychomycosis bilateral great toenails   Plan of Care:  #1 Patient was evaluated. #2 Orders for liver function tests were ordered today.  If within normal limits we will call in prescription for Lamisil 250 mg #90. #3  Discussed laser treatment.  Patient will call to make an appointment. #4  Return to clinic in 6 months.  Edrick Kins, DPM [facility]  Dr. Edrick Kins, [address]                                        [place],  [address]                Office [phone]  Fax [phone]

## 2017-10-08 ENCOUNTER — Encounter (HOSPITAL_COMMUNITY): Payer: Self-pay | Admitting: *Deleted

## 2017-10-08 ENCOUNTER — Inpatient Hospital Stay (HOSPITAL_COMMUNITY)
Admission: AD | Admit: 2017-10-08 | Discharge: 2017-10-08 | Disposition: A | Discharge status: Home / Self Care | Payer: Self-pay | Source: Ambulatory Visit | Attending: Obstetrics & Gynecology | Admitting: Obstetrics & Gynecology

## 2017-10-08 DIAGNOSIS — Z808 Family history of malignant neoplasm of other organs or systems: Secondary | ICD-10-CM | POA: Insufficient documentation

## 2017-10-08 DIAGNOSIS — Z90710 Acquired absence of both cervix and uterus: Secondary | ICD-10-CM | POA: Insufficient documentation

## 2017-10-08 DIAGNOSIS — Z202 Contact with and (suspected) exposure to infections with a predominantly sexual mode of transmission: Secondary | ICD-10-CM

## 2017-10-08 DIAGNOSIS — N760 Acute vaginitis: Secondary | ICD-10-CM | POA: Insufficient documentation

## 2017-10-08 DIAGNOSIS — M797 Fibromyalgia: Secondary | ICD-10-CM | POA: Insufficient documentation

## 2017-10-08 DIAGNOSIS — N898 Other specified noninflammatory disorders of vagina: Secondary | ICD-10-CM

## 2017-10-08 DIAGNOSIS — Z8261 Family history of arthritis: Secondary | ICD-10-CM | POA: Insufficient documentation

## 2017-10-08 DIAGNOSIS — B9689 Other specified bacterial agents as the cause of diseases classified elsewhere: Secondary | ICD-10-CM | POA: Insufficient documentation

## 2017-10-08 DIAGNOSIS — Z833 Family history of diabetes mellitus: Secondary | ICD-10-CM | POA: Insufficient documentation

## 2017-10-08 DIAGNOSIS — Z79899 Other long term (current) drug therapy: Secondary | ICD-10-CM | POA: Insufficient documentation

## 2017-10-08 DIAGNOSIS — Z9889 Other specified postprocedural states: Secondary | ICD-10-CM | POA: Insufficient documentation

## 2017-10-08 DIAGNOSIS — Z8249 Family history of ischemic heart disease and other diseases of the circulatory system: Secondary | ICD-10-CM | POA: Insufficient documentation

## 2017-10-08 DIAGNOSIS — I10 Essential (primary) hypertension: Secondary | ICD-10-CM | POA: Insufficient documentation

## 2017-10-08 LAB — WET PREP, GENITAL
Sperm: NONE SEEN
Trich, Wet Prep: NONE SEEN
Yeast Wet Prep HPF POC: NONE SEEN

## 2017-10-08 LAB — URINALYSIS, ROUTINE W REFLEX MICROSCOPIC
BILIRUBIN URINE: NEGATIVE
Bacteria, UA: NONE SEEN
GLUCOSE, UA: NEGATIVE mg/dL
HGB URINE DIPSTICK: NEGATIVE
KETONES UR: 5 mg/dL — AB
NITRITE: NEGATIVE
PROTEIN: 30 mg/dL — AB
Specific Gravity, Urine: 1.034 — ABNORMAL HIGH (ref 1.005–1.030)
pH: 5 (ref 5.0–8.0)

## 2017-10-08 MED ORDER — METRONIDAZOLE 500 MG PO TABS: 500.0000 mg | ORAL_TABLET | Freq: Two times a day (BID) | ORAL | 0 refills | Status: DC

## 2017-10-08 NOTE — MAU Note (Signed)
PT  SAYS SHE HAS VAG D/C - STARTED  2 WEEKS AGO- THOUGHT IT STOPPED  THEN CAME AGAIN 2 DAYS AGO.  NO ITCHING. , DOESN'T KNOW IF HAS AN ODOR.   DR- Osborne Oman - DR Carola Rhine.     HAS HAD BV  X2

## 2017-10-08 NOTE — Discharge Instructions (Signed)
Safe Sex Practicing safe sex means taking steps before and during sex to reduce your risk of:  Getting an STD (sexually transmitted disease).  Giving your partner an STD.  Unwanted pregnancy.  How can I practice safe sex?  To practice safe sex:  Limit your sexual partners to only one partner who is having sex with only you.  Avoid using alcohol and recreational drugs before having sex. These substances can affect your judgment.  Before having sex with a new partner: ? Talk to your partner about past partners, past STDs, and drug use. ? You and your partner should be screened for STDs and discuss the results with each other.  Check your body regularly for sores, blisters, rashes, or unusual discharge. If you notice any of these problems, visit your health care provider.  If you have symptoms of an infection or you are being treated for an STD, avoid sexual contact.  While having sex, use a condom. Make sure to: ? Use a condom every time you have vaginal, oral, or anal sex. Both females and males should wear condoms during oral sex. ? Keep condoms in place from the beginning to the end of sexual activity. ? Use a latex condom, if possible. Latex condoms offer the best protection. ? Use only water-based lubricants or oils to lubricate a condom. Using petroleum-based lubricants or oils will weaken the condom and increase the chance that it will break.  See your health care provider for regular screenings, exams, and tests for STDs.  Talk with your health care provider about the form of birth control (contraception) that is best for you.  Get vaccinated against hepatitis B and human papillomavirus (HPV).  If you are at risk of being infected with HIV (human immunodeficiency virus), talk with your health care provider about taking a prescription medicine to prevent HIV infection. You are considered at risk for HIV if: ? You are a man who has sex with other men. ? You are a  heterosexual man or woman who is sexually active with more than one partner. ? You take drugs by injection. ? You are sexually active with a partner who has HIV.  This information is not intended to replace advice given to you by your health care provider. Make sure you discuss any questions you have with your health care provider. Document Released: 09/18/2004 Document Revised: 12/26/2015 Document Reviewed: 07/01/2015 Elsevier Interactive Patient Education  2018 [employer]. Probiotics What are probiotics? Probiotics are the good bacteria and yeasts that live in your body and keep you and your digestive system healthy. Probiotics also help your body's defense (immune) system and protect your body against bad bacterial growth. Certain foods contain probiotics, such as yogurt. Probiotics can also be purchased as a supplement. As with any supplement or drug, it is important to discuss its use with your health care provider. What affects the balance of bacteria in my body? The balance of bacteria in your body can be affected by:  Antibiotic medicines. Antibiotics are sometimes necessary to treat infection. Unfortunately, they may kill good or friendly bacteria in your body as well as the bad bacteria. This may lead to stomach problems like diarrhea, gas, and cramping.  Disease. Some conditions are the result of an overgrowth of bad bacteria, yeasts, parasites, or fungi. These conditions include: ? Infectious diarrhea. ? Stomach and respiratory infections. ? Skin infections. ? Irritable bowel syndrome (IBS). ? Inflammatory bowel diseases. ? Ulcer due to Helicobacter pylori (H. pylori) infection. ?  Tooth decay and periodontal disease. ? Vaginal infections.  Stress and poor diet may also lower the good bacteria in your body. What type of probiotic is right for me? Probiotics are available over the counter at your local pharmacy, health food, or grocery store. They come in many different  forms, combinations of strains, and dosing strengths. Some may need to be refrigerated. Always read the label for storage and usage instructions. Specific strains have been shown to be more effective for certain conditions. Ask your health care provider what option is best for you. Why would I need probiotics? There are many reasons your health care provider might recommend a probiotic supplement, including:  Diarrhea.  Constipation.  IBS.  Respiratory infections.  Yeast infections.  Acne, eczema, and other skin conditions.  Frequent urinary tract infections (UTIs).  Are there side effects of probiotics? Some people experience mild side effects when taking probiotics. Side effects are usually temporary and may include:  Gas.  Bloating.  Cramping.  Rarely, serious side effects, such as infection or immune system changes, may occur. What else do I need to know about probiotics?  There are many different strains of probiotics. Certain strains may be more effective depending on your condition. Probiotics are available in varying doses. Ask your health care provider which probiotic you should use and how often.  If you are taking probiotics along with antibiotics, it is generally recommended to wait at least 2 hours between taking the antibiotic and taking the probiotic. For more information: [facility] for Complementary and Alternative Medicine LocalChronicle.com.cy This information is not intended to replace advice given to you by your health care provider. Make sure you discuss any questions you have with your health care provider. Document Released: 03/08/2014 Document Revised: 07/08/2016 Document Reviewed: 11/08/2013 Elsevier Interactive Patient Education  2017 [employer]. Bacterial Vaginosis Bacterial vaginosis is a vaginal infection that occurs when the normal balance of bacteria in the vagina is disrupted. It results from an overgrowth of certain bacteria. This is  the most common vaginal infection among women ages 44-44. Because bacterial vaginosis increases your risk for STIs (sexually transmitted infections), getting treated can help reduce your risk for chlamydia, gonorrhea, herpes, and HIV (human immunodeficiency virus). Treatment is also important for preventing complications in pregnant women, because this condition can cause an early (premature) delivery. What are the causes? This condition is caused by an increase in harmful bacteria that are normally present in small amounts in the vagina. However, the reason that the condition develops is not fully understood. What increases the risk? The following factors may make you more likely to develop this condition:  Having a new sexual partner or multiple sexual partners.  Having unprotected sex.  Douching.  Having an intrauterine device (IUD).  Smoking.  Drug and alcohol abuse.  Taking certain antibiotic medicines.  Being pregnant.  You cannot get bacterial vaginosis from toilet seats, bedding, swimming pools, or contact with objects around you. What are the signs or symptoms? Symptoms of this condition include:  Grey or white vaginal discharge. The discharge can also be watery or foamy.  A fish-like odor with discharge, especially after sexual intercourse or during menstruation.  Itching in and around the vagina.  Burning or pain with urination.  Some women with bacterial vaginosis have no signs or symptoms. How is this diagnosed? This condition is diagnosed based on:  Your medical history.  A physical exam of the vagina.  Testing a sample of vaginal fluid under a microscope  to look for a large amount of bad bacteria or abnormal cells. Your health care provider may use a cotton swab or a small wooden spatula to collect the sample.  How is this treated? This condition is treated with antibiotics. These may be given as a pill, a vaginal cream, or a medicine that is put into the  vagina (suppository). If the condition comes back after treatment, a second round of antibiotics may be needed. Follow these instructions at home: Medicines  Take over-the-counter and prescription medicines only as told by your health care provider.  Take or use your antibiotic as told by your health care provider. Do not stop taking or using the antibiotic even if you start to feel better. General instructions  If you have a female sexual partner, tell her that you have a vaginal infection. She should see her health care provider and be treated if she has symptoms. If you have a female sexual partner, he does not need treatment.  During treatment: ? Avoid sexual activity until you finish treatment. ? Do not douche. ? Avoid alcohol as directed by your health care provider. ? Avoid breastfeeding as directed by your health care provider.  Drink enough water and fluids to keep your urine clear or pale yellow.  Keep the area around your vagina and rectum clean. ? Wash the area daily with warm water. ? Wipe yourself from front to back after using the toilet.  Keep all follow-up visits as told by your health care provider. This is important. How is this prevented?  Do not douche.  Wash the outside of your vagina with warm water only.  Use protection when having sex. This includes latex condoms and dental dams.  Limit how many sexual partners you have. To help prevent bacterial vaginosis, it is best to have sex with just one partner (monogamous).  Make sure you and your sexual partner are tested for STIs.  Wear cotton or cotton-lined underwear.  Avoid wearing tight pants and pantyhose, especially during summer.  Limit the amount of alcohol that you drink.  Do not use any products that contain nicotine or tobacco, such as cigarettes and e-cigarettes. If you need help quitting, ask your health care provider.  Do not use illegal drugs. Where to find more information:  Centers for  Disease Control and Prevention: AppraiserFraud.fi  American Sexual Health Association (ASHA): www.ashastd.org  U.S. Department of Health and Financial controller, Office on Women's Health: DustingSprays.pl or SecuritiesCard.it Contact a health care provider if:  Your symptoms do not improve, even after treatment.  You have more discharge or pain when urinating.  You have a fever.  You have pain in your abdomen.  You have pain during sex.  You have vaginal bleeding between periods. Summary  Bacterial vaginosis is a vaginal infection that occurs when the normal balance of bacteria in the vagina is disrupted.  Because bacterial vaginosis increases your risk for STIs (sexually transmitted infections), getting treated can help reduce your risk for chlamydia, gonorrhea, herpes, and HIV (human immunodeficiency virus). Treatment is also important for preventing complications in pregnant women, because the condition can cause an early (premature) delivery.  This condition is treated with antibiotic medicines. These may be given as a pill, a vaginal cream, or a medicine that is put into the vagina (suppository). This information is not intended to replace advice given to you by your health care provider. Make sure you discuss any questions you have with your health care provider. Document Released: 08/11/2005  Document Revised: 12/15/2016 Document Reviewed: 04/26/2016 Elsevier Interactive Patient Education  [employer].

## 2017-10-08 NOTE — MAU Provider Note (Signed)
Chief Complaint:  Vaginal Discharge   First Provider Initiated Contact with Patient 10/08/17 2152       HPI: Monique [name] is a 42 y.o. [id number] who presents to maternity admissions reporting vaginal discharge.  White, but states "different color than usual".  Not sure if it has an odor. Has had BV inpast.   Lost insurance so cannot see her doctor.  Worried about STD, wants full testing including blood. She reports no  vaginal bleeding, vaginal itching/burning, urinary symptoms, h/a, dizziness, n/v, or fever/chills.    Vaginal Discharge  The patient's primary symptoms include vaginal discharge. The patient's pertinent negatives include no genital itching, genital lesions, genital odor, pelvic pain or vaginal bleeding. This is a new problem. The current episode started today. The problem occurs constantly. The problem has been unchanged. The patient is experiencing no pain. She is not pregnant. Pertinent negatives include no abdominal pain, back pain, chills, dysuria, fever, frequency, nausea or vomiting. The vaginal discharge was white. There has been no bleeding. She has not been passing clots. She has not been passing tissue. Nothing aggravates the symptoms. She has tried nothing for the symptoms. She is sexually active. It is possible that her partner has an STD. She uses hysterectomy for contraception.    RN Note: PT  SAYS SHE HAS VAG D/C - STARTED  2 WEEKS AGO- THOUGHT IT STOPPED  THEN CAME AGAIN 2 DAYS AGO.  NO ITCHING. , DOESN'T KNOW IF HAS AN ODOR.   DR- Osborne Oman - DR Carola Rhine.     HAS HAD BV  X2    Past Medical History: Past Medical History:  Diagnosis Date  . Abnormal Pap smear   . Alopecia   . Anemia   . Chronic headaches   . Fibromyalgia   . Hemorrhoids   . Hypertension   . Skin abnormalities    unnknown skin dx, extremely dry skin    Past obstetric history: OB History  Gravida Para Term Preterm AB Living  2 2 2  0 0 2  SAB TAB Ectopic Multiple Live Births  0 0 0 0       # Outcome Date GA Lbr Len/2nd Weight Sex Delivery Anes PTL Lv  2 Term 09/13/99     Vag-Spont     1 Term 01/13/97     Vag-Spont         Past Surgical History: Past Surgical History:  Procedure Laterality Date  . ABDOMINAL HYSTERECTOMY    . BILATERAL SALPINGECTOMY  08/19/2012   Procedure: BILATERAL SALPINGECTOMY;  Surgeon: Lahoma Crocker, MD;  Location: [facility];  Service: Gynecology;  Laterality: Bilateral;  . DIAGNOSTIC LAPAROSCOPY  2009   Rt ovarian cyst removal  . LEEP  2001 or 2002  . LEEP    . ROBOTIC ASSISTED TOTAL HYSTERECTOMY  08/19/2012   Procedure: ROBOTIC ASSISTED TOTAL HYSTERECTOMY;  Surgeon: Lahoma Crocker, MD;  Location: [facility];  Service: Gynecology;  Laterality: N/A;  . unknown     "some surgery to remove fibroid cyst that started with an 'E' "    Family History: Family History  Problem Relation Age of Onset  . Cancer Mother        cervical  . Arthritis Mother   . Hypertension Mother   . Cancer Brother   . Hypertension Brother   . Heart disease Father   . Hypertension Father   . Diabetes Sister   . Heart disease Sister   . Hypertension Sister     Social History:  Social History   Tobacco Use  . Smoking status: Never Smoker  . Smokeless tobacco: Never Used  Substance Use Topics  . Alcohol use: Yes    Alcohol/week: 0.5 oz    Types: 1 Standard drinks or equivalent per week    Comment: socially  . Drug use: No    Allergies: No Known Allergies  Meds:  Medications Prior to Admission  Medication Sig Dispense Refill Last Dose  . amLODipine-benazepril (LOTREL) 5-10 MG per capsule Take 1 capsule by mouth daily.   10/08/2017 at Unknown time  . cyclobenzaprine (FLEXERIL) 10 MG tablet Take 10 mg by mouth 2 (two) times daily.   Past Month at Unknown time  . pregabalin (LYRICA) 150 MG capsule Take 150 mg by mouth 3 (three) times daily.   10/08/2017 at Unknown time  . DULoxetine (CYMBALTA) 60 MG capsule Take 1 capsule by mouth daily.  3 More than a  month at Unknown time    I have reviewed patient's Past Medical Hx, Surgical Hx, Family Hx, Social Hx, medications and allergies.  ROS:  Review of Systems  Constitutional: Negative for chills and fever.  Gastrointestinal: Negative for abdominal pain, nausea and vomiting.  Genitourinary: Positive for vaginal discharge. Negative for dysuria, frequency and pelvic pain.  Musculoskeletal: Negative for back pain.   Other systems negative     Physical Exam   Patient Vitals for the past 24 hrs:  BP Temp Temp src Pulse Resp Height Weight  10/08/17 2134 104/71 97.7 F (36.5 C) Oral 84 20 5' 4.5" (1.638 m) 182 lb 4 oz (82.7 kg)   Constitutional: Well-developed, well-nourished female in no acute distress.  Cardiovascular: normal rate and rhythm, no ectopy audible, S1 & S2 heard, no murmur Respiratory: normal effort, no distress. Lungs CTAB with no wheezes or crackles GI: Abd soft, non-tender.  Nondistended.  No rebound, No guarding.  Bowel Sounds audible  MS: Extremities nontender, no edema, normal ROM Neurologic: Alert and oriented x 4.   Grossly nonfocal. GU: Neg CVAT. Skin:  Warm and Dry Psych:  Affect appropriate.  PELVIC EXAM: Cervix surgically absent, moderate white creamy discharge, vaginal walls and external genitalia normal Bimanual exam: Cervix  Absent, uterus absent, no masses appreciated   Labs: Results for orders placed or performed during the hospital encounter of 10/08/17 (from the past 24 hour(s))  Urinalysis, Routine w reflex microscopic     Status: Abnormal   Collection Time: 10/08/17  9:35 PM  Result Value Ref Range   Color, Urine YELLOW YELLOW   APPearance HAZY (A) CLEAR   Specific Gravity, Urine 1.034 (H) 1.005 - 1.030   pH 5.0 5.0 - 8.0   Glucose, UA NEGATIVE NEGATIVE mg/dL   Hgb urine dipstick NEGATIVE NEGATIVE   Bilirubin Urine NEGATIVE NEGATIVE   Ketones, ur 5 (A) NEGATIVE mg/dL   Protein, ur 30 (A) NEGATIVE mg/dL   Nitrite NEGATIVE NEGATIVE    Leukocytes, UA TRACE (A) NEGATIVE   RBC / HPF 0-5 0 - 5 RBC/hpf   WBC, UA 6-30 0 - 5 WBC/hpf   Bacteria, UA NONE SEEN NONE SEEN   Squamous Epithelial / LPF 0-5 (A) NONE SEEN   Mucus PRESENT   Wet prep, genital     Status: Abnormal   Collection Time: 10/08/17  9:55 PM  Result Value Ref Range   Yeast Wet Prep HPF POC NONE SEEN NONE SEEN   Trich, Wet Prep NONE SEEN NONE SEEN   Clue Cells Wet Prep HPF POC PRESENT (A) NONE  SEEN   WBC, Wet Prep HPF POC FEW (A) NONE SEEN   Sperm NONE SEEN     Imaging:  No results found.  MAU Course/MDM: I have ordered labs as follows: UA, Cultures wet prep, HbSAG, HIV, RPR Imaging ordered: none Results reviewed. Reviewed BV, other results pending     Pt stable at time of discharge.  Assessment: Vaginal discharge, Bacterial vaginosis Risk of STDs.  Plan: Discharge home Recommend Safe sex Rx sent for Flagyl for BV Recommend probiotics F/U HD prn  Encouraged to return here or to other Urgent Care/ED if she develops worsening of symptoms, increase in pain, fever, or other concerning symptoms.   Hansel Feinstein CNM, MSN Certified Nurse-Midwife 10/08/2017 9:52 PM

## 2017-10-09 LAB — RPR: RPR Ser Ql: NONREACTIVE

## 2017-10-09 LAB — HEPATITIS B SURFACE ANTIGEN: HEP B S AG: NEGATIVE

## 2017-10-09 LAB — GC/CHLAMYDIA PROBE AMP (~~LOC~~) NOT AT ARMC
CHLAMYDIA, DNA PROBE: NEGATIVE
NEISSERIA GONORRHEA: NEGATIVE

## 2017-10-09 LAB — HIV ANTIBODY (ROUTINE TESTING W REFLEX): HIV Screen 4th Generation wRfx: NONREACTIVE

## 2017-12-12 ENCOUNTER — Emergency Department
Admission: EM | Admit: 2017-12-12 | Discharge: 2017-12-12 | Disposition: A | Discharge status: Home / Self Care | Payer: Self-pay | Attending: Emergency Medicine | Admitting: Emergency Medicine

## 2017-12-12 ENCOUNTER — Encounter: Payer: Self-pay | Admitting: Emergency Medicine

## 2017-12-12 DIAGNOSIS — I10 Essential (primary) hypertension: Secondary | ICD-10-CM | POA: Insufficient documentation

## 2017-12-12 DIAGNOSIS — N898 Other specified noninflammatory disorders of vagina: Secondary | ICD-10-CM | POA: Insufficient documentation

## 2017-12-12 DIAGNOSIS — Z79899 Other long term (current) drug therapy: Secondary | ICD-10-CM | POA: Insufficient documentation

## 2017-12-12 DIAGNOSIS — N390 Urinary tract infection, site not specified: Secondary | ICD-10-CM | POA: Insufficient documentation

## 2017-12-12 LAB — URINALYSIS, COMPLETE (UACMP) WITH MICROSCOPIC
Bilirubin Urine: NEGATIVE
Glucose, UA: NEGATIVE mg/dL
Hgb urine dipstick: NEGATIVE
Ketones, ur: NEGATIVE mg/dL
Nitrite: NEGATIVE
PROTEIN: NEGATIVE mg/dL
SPECIFIC GRAVITY, URINE: 1.018 (ref 1.005–1.030)
pH: 7 (ref 5.0–8.0)

## 2017-12-12 LAB — CHLAMYDIA/NGC RT PCR (ARMC ONLY)
Chlamydia Tr: NOT DETECTED
N gonorrhoeae: NOT DETECTED

## 2017-12-12 LAB — WET PREP, GENITAL
CLUE CELLS WET PREP: NONE SEEN
SPERM: NONE SEEN
TRICH WET PREP: NONE SEEN
YEAST WET PREP: NONE SEEN

## 2017-12-12 MED ORDER — CEPHALEXIN 500 MG PO CAPS
500.0000 mg | ORAL_CAPSULE | Freq: Three times a day (TID) | ORAL | 0 refills | Status: DC
Start: 2017-12-12 — End: 2021-07-24

## 2017-12-12 NOTE — ED Provider Notes (Signed)
[facility] Emergency Department Provider Note   ____________________________________________   First MD Initiated Contact with Patient 12/12/17 [phone]     (approximate)  I have reviewed the triage vital signs and the nursing notes.   HISTORY  Chief Complaint Vaginal Itching    HPI Monique [name] is a 42 y.o. female who presents to the ED from home with a chief complaint of vaginal itching.  Patient reports vaginal itching with discharge since yesterday.  Reports she has had recent treatment of BV last month.  Last sexual intercourse 2 days ago.  Denies concern for STDs but would like to be checked.  Denies associated fever, chills, chest pain, shortness of breath, abdominal pain, pelvic pain, nausea, vomiting or dysuria.   Past Medical History:  Diagnosis Date  . Abnormal Pap smear   . Alopecia   . Anemia   . Chronic headaches   . Fibromyalgia   . Hemorrhoids   . Hypertension   . Skin abnormalities    unnknown skin dx, extremely dry skin    Patient Active Problem List   Diagnosis Date Noted  . Leiomyoma of uterus, unspecified 08/21/2012  . DUB (dysfunctional uterine bleeding) 06/02/2012    Past Surgical History:  Procedure Laterality Date  . ABDOMINAL HYSTERECTOMY    . BILATERAL SALPINGECTOMY  08/19/2012   Procedure: BILATERAL SALPINGECTOMY;  Surgeon: Lahoma Crocker, MD;  Location: [facility];  Service: Gynecology;  Laterality: Bilateral;  . DIAGNOSTIC LAPAROSCOPY  2009   Rt ovarian cyst removal  . LEEP  2001 or 2002  . LEEP    . ROBOTIC ASSISTED TOTAL HYSTERECTOMY  08/19/2012   Procedure: ROBOTIC ASSISTED TOTAL HYSTERECTOMY;  Surgeon: Lahoma Crocker, MD;  Location: [facility];  Service: Gynecology;  Laterality: N/A;  . unknown     "some surgery to remove fibroid cyst that started with an 'E' "    Prior to Admission medications   Medication Sig Start Date End Date Taking? Authorizing Provider  amLODipine-benazepril (LOTREL) 5-10 MG  per capsule Take 1 capsule by mouth daily.    [provider]  cyclobenzaprine (FLEXERIL) 10 MG tablet Take 10 mg by mouth 2 (two) times daily.    [provider]  DULoxetine (CYMBALTA) 60 MG capsule Take 1 capsule by mouth daily. 05/22/15   [provider]  metroNIDAZOLE (FLAGYL) 500 MG tablet Take 1 tablet (500 mg total) by mouth 2 (two) times daily. 10/08/17   Seabron Spates, CNM  pregabalin (LYRICA) 150 MG capsule Take 150 mg by mouth 3 (three) times daily.    [provider]    Allergies Patient has no known allergies.  Family History  Problem Relation Age of Onset  . Cancer Mother        cervical  . Arthritis Mother   . Hypertension Mother   . Cancer Brother   . Hypertension Brother   . Heart disease Father   . Hypertension Father   . Diabetes Sister   . Heart disease Sister   . Hypertension Sister     Social History Social History   Tobacco Use  . Smoking status: Never Smoker  . Smokeless tobacco: Never Used  Substance Use Topics  . Alcohol use: Yes    Alcohol/week: 0.5 oz    Types: 1 Standard drinks or equivalent per week    Comment: socially  . Drug use: No    Review of Systems  Constitutional: No fever/chills. Eyes: No visual changes. ENT: No sore throat. Cardiovascular:  Denies chest pain. Respiratory: Denies shortness of breath. Gastrointestinal: No abdominal pain.  No nausea, no vomiting.  No diarrhea.  No constipation. Genitourinary: Positive for vaginal itching and discharge.  Negative for dysuria. Musculoskeletal: Negative for back pain. Skin: Negative for rash. Neurological: Negative for headaches, focal weakness or numbness.   ____________________________________________   PHYSICAL EXAM:  VITAL SIGNS: ED Triage Vitals  Enc Vitals Group     BP 12/12/17 0509 117/82     Pulse Rate 12/12/17 0509 88     Resp 12/12/17 0509 18     Temp 12/12/17 0509 98 F (36.7 C)     Temp Source 12/12/17 0509 Oral      SpO2 12/12/17 0509 98 %     Weight 12/12/17 0510 185 lb (83.9 kg)     Height 12/12/17 0510 5\' 4"  (1.626 m)     Head Circumference --      Peak Flow --      Pain Score --      Pain Loc --      Pain Edu? --      Excl. in [place]? --     Constitutional: Alert and oriented. Well appearing and in no acute distress. Eyes: Conjunctivae are normal. PERRL. EOMI. Head: Atraumatic. Nose: No congestion/rhinnorhea. Mouth/Throat: Mucous membranes are moist.  Oropharynx non-erythematous. Neck: No stridor.   Cardiovascular: Normal rate, regular rhythm. Grossly normal heart sounds.  Good peripheral circulation. Respiratory: Normal respiratory effort.  No retractions. Lungs CTAB. Gastrointestinal: Soft and nontender to light or deep palpation. No distention. No abdominal bruits. No CVA tenderness. Musculoskeletal: No lower extremity tenderness nor edema.  No joint effusions. Neurologic:  Normal speech and language. No gross focal neurologic deficits are appreciated. No gait instability. Skin:  Skin is warm, dry and intact. No rash noted. Psychiatric: Mood and affect are normal. Speech and behavior are normal.  ____________________________________________   LABS (all labs ordered are listed, but only abnormal results are displayed)  Labs Reviewed  WET PREP, GENITAL - Abnormal; Notable for the following components:      Result Value   WBC, Wet Prep HPF POC FEW (*)    All other components within normal limits  CHLAMYDIA/NGC RT PCR ([facility] ONLY)  URINALYSIS, COMPLETE (UACMP) WITH MICROSCOPIC   ____________________________________________  EKG  None ____________________________________________  RADIOLOGY  ED MD interpretation: None  Official radiology report(s): No results found.  ____________________________________________   PROCEDURES  Procedure(s) performed:   Pelvic exam: External exam within normal limits without rashes, lesions or vesicles.  Speculum exam reveals white  discharge.  Bimanual exam within normal limits.  Procedures  Critical Care performed: No  ____________________________________________   INITIAL IMPRESSION / ASSESSMENT AND PLAN / ED COURSE  As part of my medical decision making, I reviewed the following data within the [facility] notes reviewed and incorporated, Labs reviewed and Notes from prior ED visits   42 year old female with recent bacterial vaginosis who presents with vaginal itching and discharge; pelvic pain; history of hysterectomy.  Will obtain urinalysis, wet prep, DNA and reassess.  Clinical Course as of Dec 13 715  Sat Dec 12, 2017  0716 DNA and urinalysis are pending. Care is transferred to Dr. Cherylann Banas pending these results. Anticipate patient may be safely discharged home.    [JS]    Clinical Course User Index [JS] Paulette Blanch, MD     ____________________________________________   FINAL CLINICAL IMPRESSION(S) / ED DIAGNOSES  Final diagnoses:  Vaginal itching  ED Discharge Orders    None       Note:  This document was prepared using Dragon voice recognition software and may include unintentional dictation errors.    Paulette Blanch, MD 12/12/17 [phone]

## 2017-12-12 NOTE — ED Notes (Signed)
Pt ambulatory upon discharge; refused wheel chair. Verbalized understanding of discharge instructions, follow-up care and prescriptions. VSS. Skin warm and dry. A&O x4.

## 2017-12-12 NOTE — Discharge Instructions (Addendum)
1.  Take antibiotic as prescribed (Keflex 500 mg 3 times daily for 5 days). 2.  Return to the ER for worsening symptoms, persistent vomiting, difficulty breathing or other concerns.

## 2017-12-12 NOTE — ED Provider Notes (Signed)
-----------------------------------------   7:43 AM on 12/12/2017 -----------------------------------------  I took over care of this patient from Dr. Beather Arbour.  Patient was pending wet prep and GC/CT probe.  Both of these are negative.  Per the plan established by Dr. Beather Arbour, we will treat for UTI.  Patient is comfortable, and feels well to go home.  Return precautions given, and she expresses understanding.   Arta Silence, MD 12/12/17 [phone]

## 2017-12-12 NOTE — ED Triage Notes (Signed)
Pt c/o vaginal itching as well as increased amount of vaginal discharge since Thursday. Pt has had recent diagnosis of BV.

## 2018-05-22 ENCOUNTER — Emergency Department
Admission: EM | Admit: 2018-05-22 | Discharge: 2018-05-22 | Disposition: A | Discharge status: Home / Self Care | Payer: Self-pay | Attending: Emergency Medicine | Admitting: Emergency Medicine

## 2018-05-22 ENCOUNTER — Other Ambulatory Visit: Payer: Self-pay

## 2018-05-22 ENCOUNTER — Emergency Department: Payer: Self-pay

## 2018-05-22 DIAGNOSIS — I10 Essential (primary) hypertension: Secondary | ICD-10-CM | POA: Insufficient documentation

## 2018-05-22 DIAGNOSIS — M79601 Pain in right arm: Secondary | ICD-10-CM | POA: Insufficient documentation

## 2018-05-22 DIAGNOSIS — N760 Acute vaginitis: Secondary | ICD-10-CM | POA: Insufficient documentation

## 2018-05-22 DIAGNOSIS — B9689 Other specified bacterial agents as the cause of diseases classified elsewhere: Secondary | ICD-10-CM

## 2018-05-22 DIAGNOSIS — B9789 Other viral agents as the cause of diseases classified elsewhere: Secondary | ICD-10-CM | POA: Insufficient documentation

## 2018-05-22 DIAGNOSIS — Z79899 Other long term (current) drug therapy: Secondary | ICD-10-CM | POA: Insufficient documentation

## 2018-05-22 LAB — WET PREP, GENITAL
Sperm: NONE SEEN
Trich, Wet Prep: NONE SEEN
Yeast Wet Prep HPF POC: NONE SEEN

## 2018-05-22 LAB — CHLAMYDIA/NGC RT PCR (ARMC ONLY)
Chlamydia Tr: NOT DETECTED
N gonorrhoeae: NOT DETECTED

## 2018-05-22 MED ORDER — METRONIDAZOLE 500 MG PO TABS: 500.0000 mg | ORAL_TABLET | Freq: Two times a day (BID) | ORAL | 0 refills | Status: AC

## 2018-05-22 MED ORDER — CEFTRIAXONE SODIUM 250 MG IJ SOLR
250.0000 mg | Freq: Once | INTRAMUSCULAR | Status: AC
  Administered 2018-05-22: 250 mg via INTRAMUSCULAR
  Filled 2018-05-22: qty 250

## 2018-05-22 MED ORDER — AZITHROMYCIN 500 MG PO TABS
1000.0000 mg | ORAL_TABLET | Freq: Once | ORAL | Status: AC
  Administered 2018-05-22: 1000 mg via ORAL
  Filled 2018-05-22: qty 2

## 2018-05-22 NOTE — ED Triage Notes (Signed)
Pt states for one month she has had sharp shooting pain to right arm with intermittent numbness. Pt states she also has intermittent right hand swelling. Pt states she would also like to be checked for vaginal yeast infection. Pt appears in no acute distress, moving all extremities.

## 2018-05-22 NOTE — ED Provider Notes (Signed)
[facility] Emergency Department Provider Note  Time seen: 5:49 AM  I have reviewed the triage vital signs and the nursing notes.   HISTORY  Chief Complaint Arm Pain    HPI Monique [name] is a 42 y.o. female with a past medical history of fibromyalgia, hypertension, presents to the emergency department for right arm pain and vaginal discharge.  According to the patient over the past 2 weeks she has been experiencing vaginal discharge.  Denies any abdominal pain dysuria.  She also states for the past 1 month she has been experiencing intermittent right upper extremity pain for the last several days she has noted intermittent swelling of her hand and forearm.  Denies any swelling currently but continues to state pain she describes the pain as a mild dull aching pain all the time with occasional sharp shooting pains down the arm.  No head pain or neck pain.   Past Medical History:  Diagnosis Date  . Abnormal Pap smear   . Alopecia   . Anemia   . Chronic headaches   . Fibromyalgia   . Hemorrhoids   . Hypertension   . Skin abnormalities    unnknown skin dx, extremely dry skin    Patient Active Problem List   Diagnosis Date Noted  . Leiomyoma of uterus, unspecified 08/21/2012  . DUB (dysfunctional uterine bleeding) 06/02/2012    Past Surgical History:  Procedure Laterality Date  . ABDOMINAL HYSTERECTOMY    . BILATERAL SALPINGECTOMY  08/19/2012   Procedure: BILATERAL SALPINGECTOMY;  Surgeon: Lahoma Crocker, MD;  Location: [facility];  Service: Gynecology;  Laterality: Bilateral;  . DIAGNOSTIC LAPAROSCOPY  2009   Rt ovarian cyst removal  . LEEP  2001 or 2002  . LEEP    . ROBOTIC ASSISTED TOTAL HYSTERECTOMY  08/19/2012   Procedure: ROBOTIC ASSISTED TOTAL HYSTERECTOMY;  Surgeon: Lahoma Crocker, MD;  Location: [facility];  Service: Gynecology;  Laterality: N/A;  . unknown     "some surgery to remove fibroid cyst that started with an 'E' "    Prior to  Admission medications   Medication Sig Start Date End Date Taking? Authorizing Provider  amLODipine-benazepril (LOTREL) 5-10 MG per capsule Take 1 capsule by mouth daily.    [provider]  cephALEXin (KEFLEX) 500 MG capsule Take 1 capsule (500 mg total) by mouth 3 (three) times daily. 12/12/17   Paulette Blanch, MD  cyclobenzaprine (FLEXERIL) 10 MG tablet Take 10 mg by mouth 2 (two) times daily.    [provider]  DULoxetine (CYMBALTA) 60 MG capsule Take 1 capsule by mouth daily. 05/22/15   [provider]  metroNIDAZOLE (FLAGYL) 500 MG tablet Take 1 tablet (500 mg total) by mouth 2 (two) times daily. 10/08/17   Seabron Spates, CNM  pregabalin (LYRICA) 150 MG capsule Take 150 mg by mouth 3 (three) times daily.    [provider]    No Known Allergies  Family History  Problem Relation Age of Onset  . Cancer Mother        cervical  . Arthritis Mother   . Hypertension Mother   . Cancer Brother   . Hypertension Brother   . Heart disease Father   . Hypertension Father   . Diabetes Sister   . Heart disease Sister   . Hypertension Sister     Social History Social History   Tobacco Use  . Smoking status: Never Smoker  . Smokeless tobacco: Never Used  Substance Use Topics  .  Alcohol use: Yes    Alcohol/week: 1.0 standard drinks    Types: 1 Standard drinks or equivalent per week    Comment: socially  . Drug use: No    Review of Systems Constitutional: Negative for fever. Cardiovascular: Negative for chest pain. Respiratory: Negative for shortness of breath. Gastrointestinal: Negative for abdominal pain Genitourinary: Negative for urinary compaints.  Positive for vaginal discharge of 2 weeks. Musculoskeletal: Right arm pain x1 month intermittent swelling. Skin: Negative for skin complaints  Neurological: Negative for headache All other ROS negative  ____________________________________________   PHYSICAL EXAM:  VITAL SIGNS: ED Triage  Vitals  Enc Vitals Group     BP 05/22/18 0533 125/82     Pulse Rate 05/22/18 0533 83     Resp 05/22/18 0533 16     Temp 05/22/18 0533 98.4 F (36.9 C)     Temp Source 05/22/18 0533 Oral     SpO2 05/22/18 0533 100 %     Weight 05/22/18 0535 185 lb (83.9 kg)     Height 05/22/18 0535 5\' 4"  (1.626 m)     Head Circumference --      Peak Flow --      Pain Score 05/22/18 0534 7     Pain Loc --      Pain Edu? --      Excl. in [place]? --    Constitutional: Alert and oriented. Well appearing and in no distress. Eyes: Normal exam ENT   Head: Normocephalic and atraumatic.   Mouth/Throat: Mucous membranes are moist. Cardiovascular: Normal rate, regular rhythm. No murmur Respiratory: Normal respiratory effort without tachypnea nor retractions. Breath sounds are clear  Gastrointestinal: Soft and nontender. No distention.  Musculoskeletal: Good range of motion right upper extremity in all joints, does state mild pain with range of motion.  2+ radial pulse, no edema noted.  No erythema. Neurologic:  Normal speech and language. No gross focal neurologic deficits Skin:  Skin is warm, dry and intact.  Psychiatric: Mood and affect are normal.  ____________________________________________    RADIOLOGY  Ultrasound negative for DVT.  ____________________________________________   INITIAL IMPRESSION / ASSESSMENT AND PLAN / ED COURSE  Pertinent labs & imaging results that were available during my care of the patient were reviewed by me and considered in my medical decision making (see chart for details).  Patient presents to the emergency department for 1 month of intermittent right upper extremity pain and intermittent swelling.  Good range of motion no concern for fracture or dislocation.  Denies any trauma.  Will obtain an ultrasound to rule out DVT. Patient also states 2 weeks of vaginal discharge she states she is sexually active.  We will perform a pelvic examination and send swabs.   Patient agreeable to plan.  Patient now wishes to be treated for gonorrhea and chlamydia prophylactically.  We will treat in the emergency department.  Wet prep positive for clue cells we will discharge with Flagyl.  Ultrasound negative for DVT.  ____________________________________________   FINAL CLINICAL IMPRESSION(S) / ED DIAGNOSES  right arm pain vaginal discharge Bacterial vaginitis   Harvest Dark, MD 05/22/18 [phone]

## 2019-09-22 ENCOUNTER — Telehealth: Payer: Self-pay

## 2019-10-04 ENCOUNTER — Ambulatory Visit: Payer: Self-pay | Admitting: Physician Assistant

## 2019-10-13 ENCOUNTER — Ambulatory Visit: Payer: Self-pay | Admitting: Physician Assistant

## 2019-10-13 ENCOUNTER — Telehealth: Payer: Self-pay | Admitting: Physician Assistant

## 2019-10-13 NOTE — Telephone Encounter (Signed)
Monique [name] was sent a link to connect for video appointment.  When she connected, she was driving down the road.  She was told that appointment could not be done while she was driving due to safety concerns and she said she would pull over and she was told that another link would be sent in 3-5 minutes.  A second link was sent and she connected and she was still driving down the road.  She declined to park and she was told that she would need to reschedule as appointment could not be done while she was driving.  She said okay.

## 2019-10-25 ENCOUNTER — Ambulatory Visit: Payer: Medicaid Other | Admitting: Physician Assistant

## 2020-01-04 ENCOUNTER — Encounter: Payer: Self-pay | Admitting: *Deleted

## 2020-01-04 ENCOUNTER — Other Ambulatory Visit: Payer: Self-pay

## 2020-01-04 ENCOUNTER — Ambulatory Visit: Payer: Self-pay | Attending: Oncology | Admitting: *Deleted

## 2020-01-04 VITALS — BP 127/78 | HR 87 | Temp 97.3°F | Ht 64.0 in | Wt 186.0 lb

## 2020-01-04 DIAGNOSIS — N644 Mastodynia: Secondary | ICD-10-CM

## 2020-01-04 NOTE — Progress Notes (Signed)
  Subjective:     Patient ID: Monique [name], female   DOB: February 03, 1976, 44 y.o.   MRN: [id number]  HPI   Review of Systems     Objective:   Physical Exam Chest:     Breasts:        Right: Tenderness present. No swelling, bleeding, inverted nipple, mass, nipple discharge or skin change.        Left: Tenderness present. No swelling, bleeding, inverted nipple, mass, nipple discharge or skin change.    Lymphadenopathy:     Upper Body:     Right upper body: No supraclavicular or axillary adenopathy.     Left upper body: No supraclavicular or axillary adenopathy.        Assessment:     44 year old Black female referred to [place] by the [facility] for clinical breast exam and mammogram.  Patient states she was referred to [facility] because she had a right breast mass and left breast pain.  She is a very poor historian and gets agitated when I ask questions.  States she found a lump in the upper outer quadrant of the left breast about 2 weeks ago.  States her doctor felt it, but told her it may disappear.  States "this has all been looked at before". On clinical breast exam there is scattered tender fibroglandular like tissue in bilateral breast.  There is no dominant mass, nipple discharge, skin changes or lymphadenopathy.  The patient reports a "stabbing" pain from 12:00 left breast to the nipple.  Taught self breast awareness.  Last pap on 11/2019 at the [facility].  No results for review at this time.  Patient has been screened for eligibility.  She does not have any insurance, Medicare or Medicaid.  She also meets financial eligibility.   Risk Assessment    Risk Scores      01/04/2020   Last edited by: Theodore Demark, RN   5-year risk: 0.7 %   Lifetime risk: 7.2 %            Plan:     Will get bilateral diagnostic mammogram and ultrasound.  Jeanella Anton to schedule patient for the appointment.  Will follow up per [facility] protocol.

## 2020-01-10 ENCOUNTER — Ambulatory Visit
Admission: RE | Admit: 2020-01-10 | Discharge: 2020-01-10 | Disposition: A | Discharge status: Home / Self Care | Payer: Medicaid Other | Source: Ambulatory Visit | Attending: Oncology | Admitting: Oncology

## 2020-01-10 DIAGNOSIS — N644 Mastodynia: Secondary | ICD-10-CM

## 2020-01-11 ENCOUNTER — Encounter: Payer: Self-pay | Admitting: *Deleted

## 2020-01-11 NOTE — Progress Notes (Signed)
Letter mailed from the [facility] to inform patient of her normal mammogram results.  Since there was also no findings on clinical exam  patient is to follow-up with annual screening in one year.

## 2020-03-13 ENCOUNTER — Emergency Department
Admission: EM | Admit: 2020-03-13 | Discharge: 2020-03-13 | Disposition: A | Discharge status: Home / Self Care | Payer: Medicaid Other | Attending: Student in an Organized Health Care Education/Training Program | Admitting: Student in an Organized Health Care Education/Training Program

## 2020-03-13 ENCOUNTER — Other Ambulatory Visit: Payer: Self-pay

## 2020-03-13 DIAGNOSIS — Z5321 Procedure and treatment not carried out due to patient leaving prior to being seen by health care provider: Secondary | ICD-10-CM | POA: Insufficient documentation

## 2020-03-13 DIAGNOSIS — M79601 Pain in right arm: Secondary | ICD-10-CM | POA: Insufficient documentation

## 2020-03-13 DIAGNOSIS — R42 Dizziness and giddiness: Secondary | ICD-10-CM | POA: Insufficient documentation

## 2020-03-13 DIAGNOSIS — R202 Paresthesia of skin: Secondary | ICD-10-CM | POA: Insufficient documentation

## 2020-03-13 DIAGNOSIS — M79602 Pain in left arm: Secondary | ICD-10-CM | POA: Insufficient documentation

## 2020-03-13 DIAGNOSIS — R109 Unspecified abdominal pain: Secondary | ICD-10-CM | POA: Insufficient documentation

## 2020-03-13 DIAGNOSIS — M79604 Pain in right leg: Secondary | ICD-10-CM | POA: Insufficient documentation

## 2020-03-13 DIAGNOSIS — R0602 Shortness of breath: Secondary | ICD-10-CM | POA: Insufficient documentation

## 2020-03-13 DIAGNOSIS — M79605 Pain in left leg: Secondary | ICD-10-CM | POA: Insufficient documentation

## 2020-03-13 LAB — BASIC METABOLIC PANEL
Anion gap: 8 (ref 5–15)
BUN: 14 mg/dL (ref 6–20)
CO2: 25 mmol/L (ref 22–32)
Calcium: 9.2 mg/dL (ref 8.9–10.3)
Chloride: 106 mmol/L (ref 98–111)
Creatinine, Ser: 0.73 mg/dL (ref 0.44–1.00)
GFR calc Af Amer: >60 mL/min (ref >60)
GFR calc non Af Amer: >60 mL/min (ref >60)
Glucose, Bld: 149 mg/dL — ABNORMAL HIGH (ref 70–99)
Potassium: 3.3 mmol/L — ABNORMAL LOW (ref 3.5–5.1)
Sodium: 139 mmol/L (ref 135–145)

## 2020-03-13 LAB — CBC
HCT: 37.9 % (ref 36.0–46.0)
Hemoglobin: 12.4 g/dL (ref 12.0–15.0)
MCH: 30 pg (ref 26.0–34.0)
MCHC: 32.7 g/dL (ref 30.0–36.0)
MCV: 91.8 fL (ref 80.0–100.0)
Platelets: 215 10*3/uL (ref 150–400)
RBC: 4.13 MIL/uL (ref 3.87–5.11)
RDW: 12 % (ref 11.5–15.5)
WBC: 6.5 10*3/uL (ref 4.0–10.5)
nRBC: 0 % (ref 0.0–0.2)

## 2020-03-13 LAB — HEPATIC FUNCTION PANEL
ALT: 24 U/L (ref 0–44)
AST: 26 U/L (ref 15–41)
Albumin: 4 g/dL (ref 3.5–5.0)
Alkaline Phosphatase: 52 U/L (ref 38–126)
Bilirubin, Direct: <0.1 mg/dL (ref 0.0–0.2)
Total Bilirubin: 1 mg/dL (ref 0.3–1.2)
Total Protein: 7.3 g/dL (ref 6.5–8.1)

## 2020-03-13 LAB — LIPASE, BLOOD: Lipase: 29 U/L (ref 11–51)

## 2020-03-13 MED ORDER — SODIUM CHLORIDE 0.9% FLUSH: 3.0000 mL | Freq: Once | INTRAVENOUS | Status: DC

## 2020-03-13 NOTE — ED Triage Notes (Addendum)
Pt comes via POV from home with c/o dizziness and some SOB. Pt states this has been going on for awhile. Pt states tingling and pain in arms and legs. Pt states she is always tired.  Pt denies any CP. Pt states left sided flank pain as well.

## 2021-07-09 ENCOUNTER — Other Ambulatory Visit (HOSPITAL_COMMUNITY): Payer: Self-pay | Admitting: Sports Medicine

## 2021-07-09 DIAGNOSIS — Z1231 Encounter for screening mammogram for malignant neoplasm of breast: Secondary | ICD-10-CM

## 2021-07-24 ENCOUNTER — Ambulatory Visit
Admission: EM | Admit: 2021-07-24 | Discharge: 2021-07-24 | Disposition: A | Discharge status: Home / Self Care | Payer: Medicaid Other | Attending: Family Medicine | Admitting: Family Medicine

## 2021-07-24 ENCOUNTER — Ambulatory Visit: Payer: Self-pay

## 2021-07-24 ENCOUNTER — Other Ambulatory Visit: Payer: Self-pay

## 2021-07-24 ENCOUNTER — Ambulatory Visit (HOSPITAL_COMMUNITY): Payer: Medicaid Other

## 2021-07-24 ENCOUNTER — Encounter: Payer: Self-pay | Admitting: Emergency Medicine

## 2021-07-24 DIAGNOSIS — M79672 Pain in left foot: Secondary | ICD-10-CM

## 2021-07-24 MED ORDER — IBUPROFEN 800 MG PO TABS: 800.0000 mg | ORAL_TABLET | Freq: Three times a day (TID) | ORAL | 0 refills | Status: AC

## 2021-07-24 NOTE — ED Triage Notes (Signed)
Patient c/o LFT foot pain that started yesterday.   Patient denies fall or trauma.   Patient denies previous injury.   Patient denies swelling.   Patient hasn't taken any medications for symptoms.

## 2021-07-26 ENCOUNTER — Encounter (HOSPITAL_COMMUNITY): Payer: Self-pay

## 2021-07-26 ENCOUNTER — Ambulatory Visit (HOSPITAL_COMMUNITY)
Admission: RE | Admit: 2021-07-26 | Discharge: 2021-07-26 | Disposition: A | Discharge status: Home / Self Care | Payer: Medicaid Other | Source: Ambulatory Visit | Attending: Sports Medicine | Admitting: Sports Medicine

## 2021-07-26 ENCOUNTER — Other Ambulatory Visit: Payer: Self-pay

## 2021-07-26 DIAGNOSIS — Z1231 Encounter for screening mammogram for malignant neoplasm of breast: Secondary | ICD-10-CM | POA: Insufficient documentation

## 2021-07-27 NOTE — ED Provider Notes (Signed)
[place]   [phone] 07/24/21 Arrival Time: [place]:  1. Left foot pain    I have personally viewed the imaging studies ordered this visit. No bony abnormalities. Discussed likely strain of foot. WBAT.  Meds ordered this encounter  Medications   ibuprofen (ADVIL) 800 MG tablet    Sig: Take 1 tablet (800 mg total) by mouth 3 (three) times daily with meals.    Dispense:  21 tablet    Refill:  0    Orders Placed This Encounter  Procedures   DG Foot Complete Left    Recommend:  Follow-up [facility] [place].   Why: As needed. Contact information: [address] [place] [address] [phone]                  Reviewed expectations re: course of current medical issues. Questions answered. Outlined signs and symptoms indicating need for more acute intervention. Patient verbalized understanding. After Visit Summary given.  SUBJECTIVE: History from: patient. Monique [name] is a 45 y.o. female who reports dorsal foot pain, worse with ambulation. First noted yesterday. No injury/trauma. No swelling/bruising. No extremity sensation changes or weakness. No tx PTA.  Past Surgical History:  Procedure Laterality Date   ABDOMINAL HYSTERECTOMY     BILATERAL SALPINGECTOMY  08/19/2012   Procedure: BILATERAL SALPINGECTOMY;  Surgeon: Lahoma Crocker, MD;  Location: [facility];  Service: Gynecology;  Laterality: Bilateral;   DIAGNOSTIC LAPAROSCOPY  2009   Rt ovarian cyst removal   LEEP  2001 or 2002   LEEP     ROBOTIC ASSISTED TOTAL HYSTERECTOMY  08/19/2012   Procedure: ROBOTIC ASSISTED TOTAL HYSTERECTOMY;  Surgeon: Lahoma Crocker, MD;  Location: [facility];  Service: Gynecology;  Laterality: N/A;   unknown     "some surgery to remove fibroid cyst that started with an 'E' "      OBJECTIVE:  Vitals:   07/24/21 1912  BP: (!) 144/90  Pulse: 82  Resp: 14  Temp: 98.9 F (37.2 C)  TempSrc: Oral   SpO2: 97%    General appearance: alert; no distress HEENT: [place]; AT Neck: supple with FROM Resp: unlabored respirations Extremities: LLE: warm with well perfused appearance; fairly well localized moderate tenderness over left dorsal foot; without gross deformities; swelling: none; bruising: none; ankle and all toes with FROM. CV: brisk extremity capillary refill of LLE; 2+ radial pulse of LLE. Skin: warm and dry; no visible rashes Neurologic: normal sensation and strength of LLE Psychological: alert and cooperative; normal mood and affect  Imaging: DG Foot Complete Left  Result Date: 07/24/2021 CLINICAL DATA:  Left foot pain EXAM: LEFT FOOT - COMPLETE 3+ VIEW COMPARISON:  None. FINDINGS: There is no evidence of fracture or dislocation. There is no evidence of arthropathy or other focal bone abnormality. Soft tissues are unremarkable. IMPRESSION: Negative. Electronically Signed   By: Ulyses Jarred M.D.   On: 07/24/2021 19:28       No Known Allergies  Past Medical History:  Diagnosis Date   Abnormal Pap smear    Alopecia    Anemia    Chronic headaches    Fibromyalgia    Hemorrhoids    Hypertension    Skin abnormalities    unnknown skin dx, extremely dry skin   Social History   Socioeconomic History   Marital status: Legally Separated    Spouse name: Not on file   Number of children: Not on file   Years  of education: Not on file   Highest education level: Not on file  Occupational History   Not on file  Tobacco Use   Smoking status: Never   Smokeless tobacco: Never  Substance and Sexual Activity   Alcohol use: Yes    Alcohol/week: 1.0 standard drink    Types: 1 Standard drinks or equivalent per week    Comment: socially   Drug use: No   Sexual activity: Yes    Birth control/protection: Surgical  Other Topics Concern   Not on file  Social History Narrative   Not on file   Social Determinants of Health   Financial Resource Strain: Not on file  Food  Insecurity: Not on file  Transportation Needs: Not on file  Physical Activity: Not on file  Stress: Not on file  Social Connections: Not on file   Family History  Problem Relation Age of Onset   Cancer Mother        cervical   Arthritis Mother    Hypertension Mother    Cancer Brother    Hypertension Brother    Heart disease Father    Hypertension Father    Diabetes Sister    Heart disease Sister    Hypertension Sister    Breast cancer Neg Hx    Past Surgical History:  Procedure Laterality Date   ABDOMINAL HYSTERECTOMY     BILATERAL SALPINGECTOMY  08/19/2012   Procedure: BILATERAL SALPINGECTOMY;  Surgeon: Lahoma Crocker, MD;  Location: [facility];  Service: Gynecology;  Laterality: Bilateral;   DIAGNOSTIC LAPAROSCOPY  2009   Rt ovarian cyst removal   LEEP  2001 or 2002   LEEP     ROBOTIC ASSISTED TOTAL HYSTERECTOMY  08/19/2012   Procedure: ROBOTIC ASSISTED TOTAL HYSTERECTOMY;  Surgeon: Lahoma Crocker, MD;  Location: [facility];  Service: Gynecology;  Laterality: N/A;   unknown     "some surgery to remove fibroid cyst that started with an 'E' "       Monique Kick, MD 07/27/21 1011

## 2021-08-07 ENCOUNTER — Other Ambulatory Visit (HOSPITAL_COMMUNITY): Payer: Self-pay | Admitting: Sports Medicine

## 2021-08-07 DIAGNOSIS — N6320 Unspecified lump in the left breast, unspecified quadrant: Secondary | ICD-10-CM

## 2021-08-13 ENCOUNTER — Other Ambulatory Visit: Payer: Self-pay

## 2021-08-13 ENCOUNTER — Ambulatory Visit (HOSPITAL_COMMUNITY)
Admission: RE | Admit: 2021-08-13 | Discharge: 2021-08-13 | Disposition: A | Discharge status: Home / Self Care | Payer: Self-pay | Source: Ambulatory Visit | Attending: Sports Medicine | Admitting: Sports Medicine

## 2021-08-13 ENCOUNTER — Ambulatory Visit (HOSPITAL_COMMUNITY)
Admission: RE | Admit: 2021-08-13 | Discharge: 2021-08-13 | Disposition: A | Discharge status: Home / Self Care | Payer: Medicaid Other | Source: Ambulatory Visit | Attending: Sports Medicine | Admitting: Sports Medicine

## 2021-08-13 DIAGNOSIS — N6320 Unspecified lump in the left breast, unspecified quadrant: Secondary | ICD-10-CM | POA: Insufficient documentation

## 2021-11-18 ENCOUNTER — Telehealth: Payer: Self-pay

## 2022-01-13 DIAGNOSIS — R569 Unspecified convulsions: Secondary | ICD-10-CM | POA: Diagnosis not present

## 2022-02-11 ENCOUNTER — Telehealth: Payer: Self-pay

## 2022-02-11 NOTE — Telephone Encounter (Signed)
sent reminder text message to patient to confirm pts transportation was scheduled with [facility] for her appt on June 27,2023 at 11am.  All information and expectations for day of transportation was provided within the text message

## 2022-02-11 NOTE — Telephone Encounter (Signed)
Pt requested transportation for an upcoming medical appt with [facility] Tuesday, February 18, 2022 at 11am  Pt was advised her transportation would be scheduled immediately following the phone call  Pt stated she understood and call ended

## 2022-05-09 ENCOUNTER — Ambulatory Visit: Payer: Medicaid Other | Admitting: Cardiology

## 2022-09-15 ENCOUNTER — Other Ambulatory Visit: Payer: Self-pay | Admitting: Family Medicine

## 2022-09-15 DIAGNOSIS — Z1231 Encounter for screening mammogram for malignant neoplasm of breast: Secondary | ICD-10-CM

## 2022-09-23 ENCOUNTER — Ambulatory Visit
Admission: RE | Admit: 2022-09-23 | Discharge: 2022-09-23 | Disposition: A | Discharge status: Home / Self Care | Payer: Medicaid Other | Source: Ambulatory Visit | Attending: Family | Admitting: Family

## 2022-09-23 ENCOUNTER — Other Ambulatory Visit: Payer: Self-pay | Admitting: Family

## 2022-09-23 ENCOUNTER — Ambulatory Visit
Admission: RE | Admit: 2022-09-23 | Discharge: 2022-09-23 | Disposition: A | Discharge status: Home / Self Care | Payer: Medicaid Other | Attending: Family | Admitting: Family

## 2022-09-23 DIAGNOSIS — S239XXA Sprain of unspecified parts of thorax, initial encounter: Secondary | ICD-10-CM

## 2022-09-23 DIAGNOSIS — S335XXA Sprain of ligaments of lumbar spine, initial encounter: Secondary | ICD-10-CM | POA: Insufficient documentation

## 2022-09-25 ENCOUNTER — Institutional Professional Consult (permissible substitution): Payer: Medicaid Other | Admitting: Pulmonary Disease

## 2022-10-06 ENCOUNTER — Ambulatory Visit: Payer: Medicaid Other | Admitting: Nurse Practitioner

## 2022-10-06 ENCOUNTER — Emergency Department (HOSPITAL_BASED_OUTPATIENT_CLINIC_OR_DEPARTMENT_OTHER)
Admission: EM | Admit: 2022-10-06 | Discharge: 2022-10-07 | Disposition: A | Discharge status: Home / Self Care | Payer: Medicaid Other | Attending: Emergency Medicine | Admitting: Emergency Medicine

## 2022-10-06 ENCOUNTER — Encounter (HOSPITAL_BASED_OUTPATIENT_CLINIC_OR_DEPARTMENT_OTHER): Payer: Self-pay

## 2022-10-06 ENCOUNTER — Encounter: Payer: Self-pay | Admitting: Nurse Practitioner

## 2022-10-06 ENCOUNTER — Emergency Department (HOSPITAL_BASED_OUTPATIENT_CLINIC_OR_DEPARTMENT_OTHER): Payer: Medicaid Other | Admitting: Radiology

## 2022-10-06 ENCOUNTER — Other Ambulatory Visit: Payer: Self-pay

## 2022-10-06 VITALS — BP 150/90 | HR 66 | Ht 64.5 in | Wt 218.2 lb

## 2022-10-06 DIAGNOSIS — G4719 Other hypersomnia: Secondary | ICD-10-CM

## 2022-10-06 DIAGNOSIS — I10 Essential (primary) hypertension: Secondary | ICD-10-CM | POA: Diagnosis not present

## 2022-10-06 DIAGNOSIS — R0789 Other chest pain: Secondary | ICD-10-CM | POA: Insufficient documentation

## 2022-10-06 DIAGNOSIS — Z79899 Other long term (current) drug therapy: Secondary | ICD-10-CM | POA: Insufficient documentation

## 2022-10-06 DIAGNOSIS — E669 Obesity, unspecified: Secondary | ICD-10-CM

## 2022-10-06 DIAGNOSIS — G4700 Insomnia, unspecified: Secondary | ICD-10-CM | POA: Diagnosis not present

## 2022-10-06 LAB — BASIC METABOLIC PANEL
Anion gap: 10 (ref 5–15)
BUN: 8 mg/dL (ref 6–20)
CO2: 28 mmol/L (ref 22–32)
Calcium: 9.6 mg/dL (ref 8.9–10.3)
Chloride: 104 mmol/L (ref 98–111)
Creatinine, Ser: 0.65 mg/dL (ref 0.44–1.00)
GFR, Estimated: >60 mL/min (ref >60)
Glucose, Bld: 88 mg/dL (ref 70–99)
Potassium: 3.6 mmol/L (ref 3.5–5.1)
Sodium: 142 mmol/L (ref 135–145)

## 2022-10-06 LAB — CBC
HCT: 38.8 % (ref 36.0–46.0)
Hemoglobin: 12.5 g/dL (ref 12.0–15.0)
MCH: 29.3 pg (ref 26.0–34.0)
MCHC: 32.2 g/dL (ref 30.0–36.0)
MCV: 90.9 fL (ref 80.0–100.0)
Platelets: 203 10*3/uL (ref 150–400)
RBC: 4.27 MIL/uL (ref 3.87–5.11)
RDW: 13.2 % (ref 11.5–15.5)
WBC: 6.4 10*3/uL (ref 4.0–10.5)
nRBC: 0 % (ref 0.0–0.2)

## 2022-10-06 LAB — D-DIMER, QUANTITATIVE: D-Dimer, Quant: 0.27 ug/mL-FEU (ref 0.00–0.50)

## 2022-10-06 LAB — TROPONIN I (HIGH SENSITIVITY): Troponin I (High Sensitivity): <2 ng/L (ref <18)

## 2022-10-06 MED ORDER — KETOROLAC TROMETHAMINE 15 MG/ML IJ SOLN
30.0000 mg | Freq: Once | INTRAMUSCULAR | Status: AC
Start: 2022-10-06 — End: 2022-10-06
  Administered 2022-10-06: 30 mg via INTRAVENOUS
  Filled 2022-10-06: qty 2

## 2022-10-06 NOTE — Assessment & Plan Note (Signed)
Restless sleep. Issues with sleep latency and maintenance. Possible this is related to untreated OSA. See above. We did discuss that she is only getting between 3-4 hours a night and this is a large factor of her daytime symptoms. She will need to work on sleep hygiene measures and aiming for 7-8 hours.

## 2022-10-06 NOTE — ED Provider Notes (Signed)
[facility] Provider Note   CSN: [id number] Arrival date & time: 10/06/22  1630     History  Chief Complaint  Patient presents with   Chest Pain     Monique [name] is a 47 y.o. female with a PMHx of HTN, who presents to the Emergency Department complaining of intermittent, throbbing/nagging, sharp sternal CP onset 3 days. Not worse with taking a deep breath, was earlier. When she has the pain it will last for several minutes and then resolves.  Notes that she has had increased stressors at work. Tried NTG last taken at yesterday, took two, pain went away and then came back. Denies recent immobilization, surgery, HRT, OCPs, history of malignancy, or anticoagulant use. Denies prior MI, CAD, or cardiac cath.  Has a cardiologist and she last was evaluated by them in August after an episode of similar symptoms.  At that time had a negative stress test and was told to follow-up as needed.  The history is provided by the patient. No language interpreter was used.       Home Medications Prior to Admission medications   Medication Sig Start Date End Date Taking? Authorizing Provider  amLODipine-benazepril (LOTREL) 5-10 MG per capsule Take 1 capsule by mouth daily.    [provider]  cyclobenzaprine (FLEXERIL) 10 MG tablet Take 10 mg by mouth 2 (two) times daily.    [provider]  DULoxetine (CYMBALTA) 60 MG capsule Take 1 capsule by mouth daily. 05/22/15   [provider]  ibuprofen (ADVIL) 800 MG tablet Take 1 tablet (800 mg total) by mouth 3 (three) times daily with meals. 07/24/21   Vanessa Kick, MD  metoprolol tartrate (LOPRESSOR) 25 MG tablet Take 25 mg by mouth 2 (two) times daily. 06/27/21   [provider]  nitroGLYCERIN (NITROSTAT) 0.4 MG SL tablet SMARTSIG:1 Tablet(s) Sublingual PRN 07/08/21   [provider]  pregabalin (LYRICA) 150 MG capsule Take 150 mg by mouth 3 (three) times daily.     [provider]      Allergies    Patient has no known allergies.    Review of Systems   Review of Systems  Cardiovascular:  Positive for chest pain.  All other systems reviewed and are negative.   Physical Exam Updated Vital Signs BP 130/82   Pulse 71   Temp 97.7 F (36.5 C) (Oral)   Resp 16   Ht 5' 5"$  (1.651 m)   Wt 99 kg   LMP 07/30/2012   SpO2 95%   BMI 36.32 kg/m  Physical Exam Vitals and nursing note reviewed.  Constitutional:      General: She is not in acute distress.    Appearance: Normal appearance.  Eyes:     General: No scleral icterus.    Extraocular Movements: Extraocular movements intact.  Cardiovascular:     Rate and Rhythm: Normal rate and regular rhythm.     Pulses: Normal pulses.     Heart sounds: Normal heart sounds.  Pulmonary:     Effort: Pulmonary effort is normal. No respiratory distress.     Breath sounds: Normal breath sounds.  Chest:     Chest wall: Tenderness present.     Comments: Mild chest wall TTP Abdominal:     Palpations: Abdomen is soft. There is no mass.     Tenderness: There is no abdominal tenderness.  Musculoskeletal:        General: Normal range of motion.  Cervical back: Neck supple.  Skin:    General: Skin is warm and dry.     Findings: No rash.  Neurological:     Mental Status: She is alert.     Sensory: Sensation is intact.     Motor: Motor function is intact.  Psychiatric:        Behavior: Behavior normal.     ED Results / Procedures / Treatments   Labs (all labs ordered are listed, but only abnormal results are displayed) Labs Reviewed  BASIC METABOLIC PANEL  CBC  D-DIMER, QUANTITATIVE  TROPONIN I (HIGH SENSITIVITY)  TROPONIN I (HIGH SENSITIVITY)    EKG None  Radiology DG Chest 2 View  Result Date: 10/06/2022 CLINICAL DATA:  Chest pain EXAM: CHEST - 2 VIEW COMPARISON:  Chest radiographs 04/06/2022 FINDINGS: Stable cardiomediastinal contours. The lungs are clear. No pneumothorax or  pleural effusion. No acute finding in the visualized skeleton. IMPRESSION: No acute cardiopulmonary finding. Electronically Signed   By: Audie Pinto M.D.   On: 10/06/2022 17:46    Procedures Procedures    Medications Ordered in ED Medications  ketorolac (TORADOL) 15 MG/ML injection 30 mg (30 mg Intravenous Given 10/06/22 2232)    ED Course/ Medical Decision Making/ A&P Clinical Course as of 10/07/22 1539  Mon Oct 06, 2022  2242 Pt re-evaluated and resting comfortably on side of the bed fully clothed and notes improvement of symptoms with treatment regimen in the ED. [SB]  Tue Oct 07, 2022  0033 Discussed with patient lab findings.  Patient at this time still asymptomatic without chest pain for >5 hours.  Discussed with patient importance of follow-up with her primary care provider as well as cardiologist regarding today's ED visit. Pt appears safe for discharge at this time.  [SB]    Clinical Course User Index [SB] Laurna Shetley A, PA-C                              Medical Decision Making Amount and/or Complexity of Data Reviewed Labs: ordered. Radiology: ordered.  Risk Prescription drug management.   Patient presents to the ED with intermittent sharp sternal chest pain x 3 days. No prior history of MI, catheterization, DVT/PE. Vital signs patient afebrile, patient not hypoxic or tachycardic. On exam patient with mild chest wall tenderness to palpation. Otherwise, no acute cardiovascular, respiratory, abdominal findings. Differential diagnosis includes ACS, pneumothorax, PE, PNA.    Additional history obtained:  External records from outside source obtained and reviewed including: Patient with similar symptoms in August 2023.  Patient was evaluated by her cardiologist at that time and had a negative workup.  Instructed to follow-up as needed.   Labs:  I ordered, and personally interpreted labs.  The pertinent results include:   Initial and delta troponin less than 2 CBC  and BMP unremarkable. Negative D-dimer  Imaging: I ordered imaging studies including CXR I independently visualized and interpreted imaging which showed: No acute findings I agree with the radiologist interpretation  Medications:  I ordered medication including Toradol for symptom management Reevaluation of the patient after these medicines and interventions, I reevaluated the patient and found that they have improved I have reviewed the patients home medicines and have made adjustments as needed   Disposition: Presenting suspicious for atypical chest pain. EKG without acute ST/T changes, troponins negative, chest x-ray negative, low suspicion for ACS at this time. Chest x-ray without acute findings, vital signs stable, doubt pneumonia or  pneumothorax at this time.  Dimer negative in the emergency department, low suspicion for PE at this time.  Heart score, patient at low risk.  Chest pain resolved after medication in the ED.  Prior to administration, pain was intermittent.  After consideration of the diagnostic results and the patients response to treatment, I feel that the patient would benefit from Discharge home. Supportive care measures and strict return precautions discussed with patient at bedside. Pt acknowledges and verbalizes understanding. Pt appears safe for discharge. Follow up as indicated in discharge paperwork.   This chart was dictated using voice recognition software, Dragon. Despite the best efforts of this provider to proofread and correct errors, errors may still occur which can change documentation meaning.  Final Clinical Impression(s) / ED Diagnoses Final diagnoses:  Atypical chest pain    Rx / DC Orders ED Discharge Orders     None         Murphy Duzan A, PA-C 10/07/22 1539    Charlesetta Shanks, MD 10/16/22 1550

## 2022-10-06 NOTE — Discharge Instructions (Addendum)
It was a pleasure taking care of you today!   Your workup was negative in the ED. You may use over-the-counter 500 mg Tylenol every 6 hours and alternate with 600 mg ibuprofen every 6 hours as needed for pain for no more than 7 days.  Follow-up with your primary care provider for evaluation of your symptoms.  Call your cardiologist tomorrow to set up a follow-up appointment regarding today's ED visit.  You may follow-up with your primary care provider as needed.  Return to the emergency department if you are experiencing increasing/worsening symptoms.

## 2022-10-06 NOTE — ED Triage Notes (Signed)
Patient here POV from Home.  Endorses CP that began 3 Days ago. Intermittent initially but has been Constant today. Mid Chest. Stabbing.  No N/V. No SOB.   NAD Noted during Triage. A&Ox4. Gcs 15. Ambulatory

## 2022-10-06 NOTE — Assessment & Plan Note (Addendum)
She has snoring, excessive daytime sleepiness, chronic headaches, restless sleep. BMI 36. History of HTN. Epworth 19. Given this,  I am concerned she could have sleep disordered breathing with obstructive sleep apnea. She will need sleep study for further evaluation.    - discussed how weight can impact sleep and risk for sleep disordered breathing - discussed options to assist with weight loss: combination of diet modification, cardiovascular and strength training exercises   - had an extensive discussion regarding the adverse health consequences related to untreated sleep disordered breathing - specifically discussed the risks for hypertension, coronary artery disease, cardiac dysrhythmias, cerebrovascular disease, and diabetes - lifestyle modification discussed   - discussed how sleep disruption can increase risk of accidents, particularly when driving - safe driving practices were discussed  Patient Instructions  Given your symptoms, I am concerned that you may have sleep disordered breathing with sleep apnea. You will need a sleep study for further evaluation. Someone will contact you to schedule this   We discussed how untreated sleep apnea puts an individual at risk for cardiac arrhthymias, pulm HTN, DM, stroke and increases their risk for daytime accidents. We also briefly reviewed treatment options including weight loss, side sleeping position, oral appliance, CPAP therapy or referral to ENT for possible surgical options  Use caution when driving and pull over if you become sleepy  Follow up after your sleep study with Joellen Jersey Nikodem Leadbetter,NP to discuss results, or sooner, if needed

## 2022-10-06 NOTE — Assessment & Plan Note (Addendum)
BMI 36. Healthy weight loss encouraged.
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
## 2022-10-06 NOTE — Patient Instructions (Addendum)
Given your symptoms, I am concerned that you may have sleep disordered breathing with sleep apnea. You will need a sleep study for further evaluation. Someone will contact you to schedule this   We discussed how untreated sleep apnea puts an individual at risk for cardiac arrhthymias, pulm HTN, DM, stroke and increases their risk for daytime accidents. We also briefly reviewed treatment options including weight loss, side sleeping position, oral appliance, CPAP therapy or referral to ENT for possible surgical options  Use caution when driving and pull over if you become sleepy  Follow up after your sleep study with Monique Jersey Hrishikesh Hoeg,NP to discuss results, or sooner, if needed

## 2022-10-07 LAB — TROPONIN I (HIGH SENSITIVITY): Troponin I (High Sensitivity): <2 ng/L (ref <18)

## 2022-10-07 NOTE — ED Notes (Signed)
Pt verbalized understanding of d/c instructions, meds, and followup care. Denies questions. VSS, no distress noted. Steady gait to exit with all belongings. 

## 2023-05-25 IMAGING — DX DG FOOT COMPLETE 3+V*L*
3 series · 3 of 3 positions shown · non-contrast
Comparison: None.

CLINICAL DATA: Left foot pain

EXAM:
LEFT FOOT - COMPLETE 3+ VIEW

[foot ap]
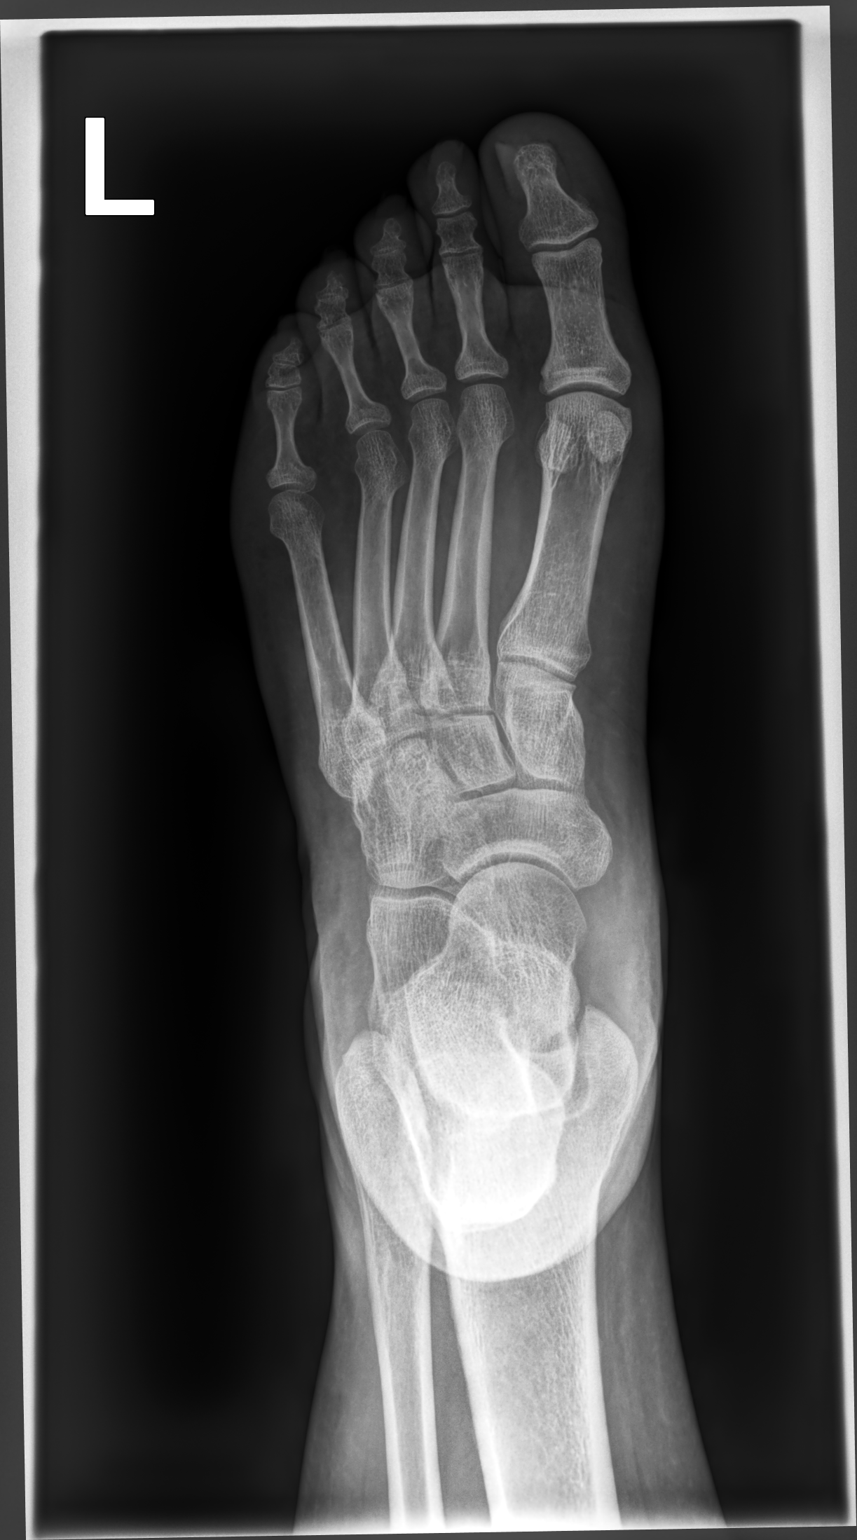

[foot mlo]
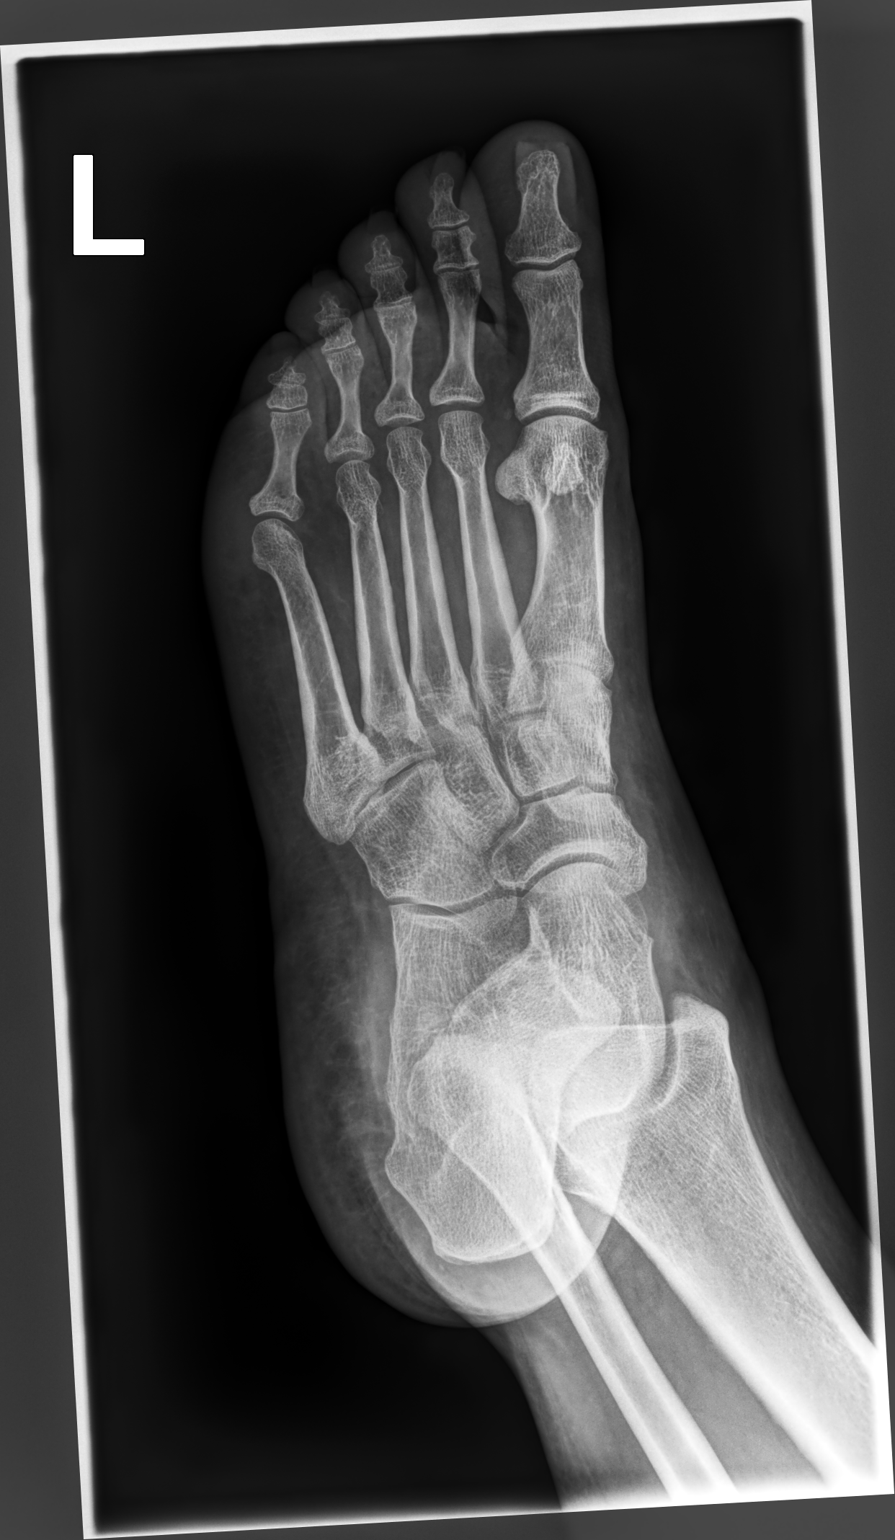

[foot lat]
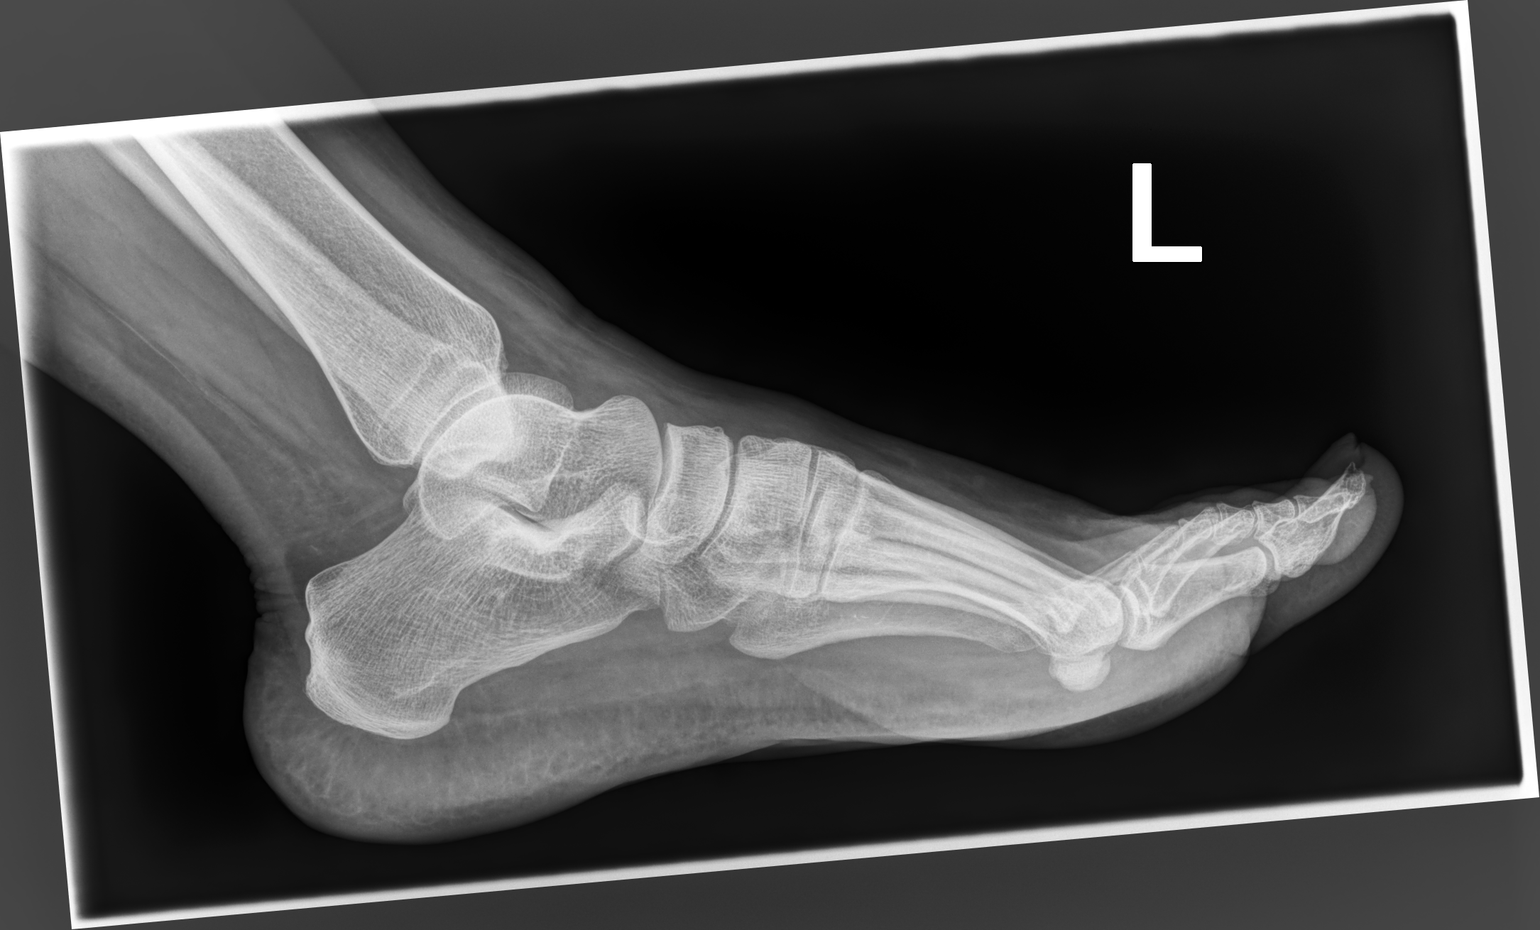

[3 of 3 positions shown; findings below may reference images not displayed]

FINDINGS: There is no evidence of fracture or dislocation. There is no
evidence of arthropathy or other focal bone abnormality. Soft
tissues are unremarkable.
IMPRESSION: Negative.

## 2023-06-14 IMAGING — MG DIGITAL DIAGNOSTIC BILAT W/ TOMO W/ CAD
6 of 9 series · 6 of 25 positions shown · non-contrast
Comparison: Previous exams.

CLINICAL DATA: 45-year-old female with a palpable area of concern
in the left breast which she no longer feels however has persistent
tenderness in this location.

EXAM:
DIGITAL DIAGNOSTIC BILATERAL MAMMOGRAM WITH TOMOSYNTHESIS AND CAD;
ULTRASOUND LEFT BREAST LIMITED
TECHNIQUE: Bilateral digital diagnostic mammography and breast tomosynthesis
was performed. The images were evaluated with computer-aided
detection.; Targeted ultrasound examination of the left breast was
performed.

[L TAN]
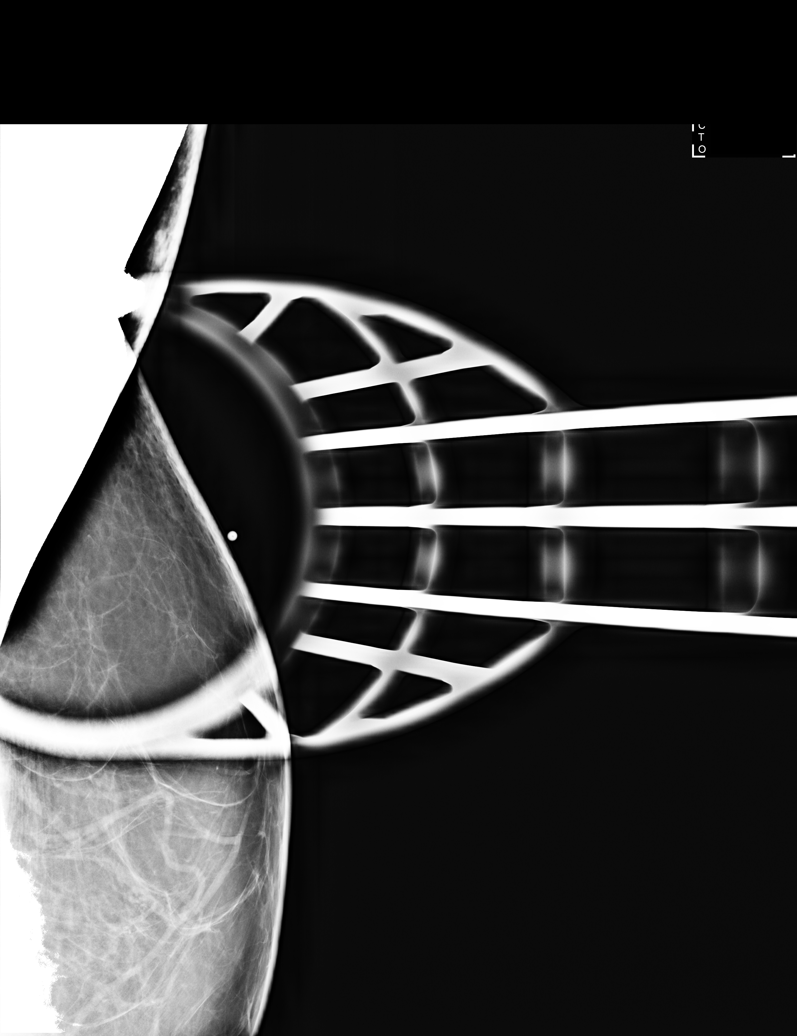

[R MLO synth-2D]
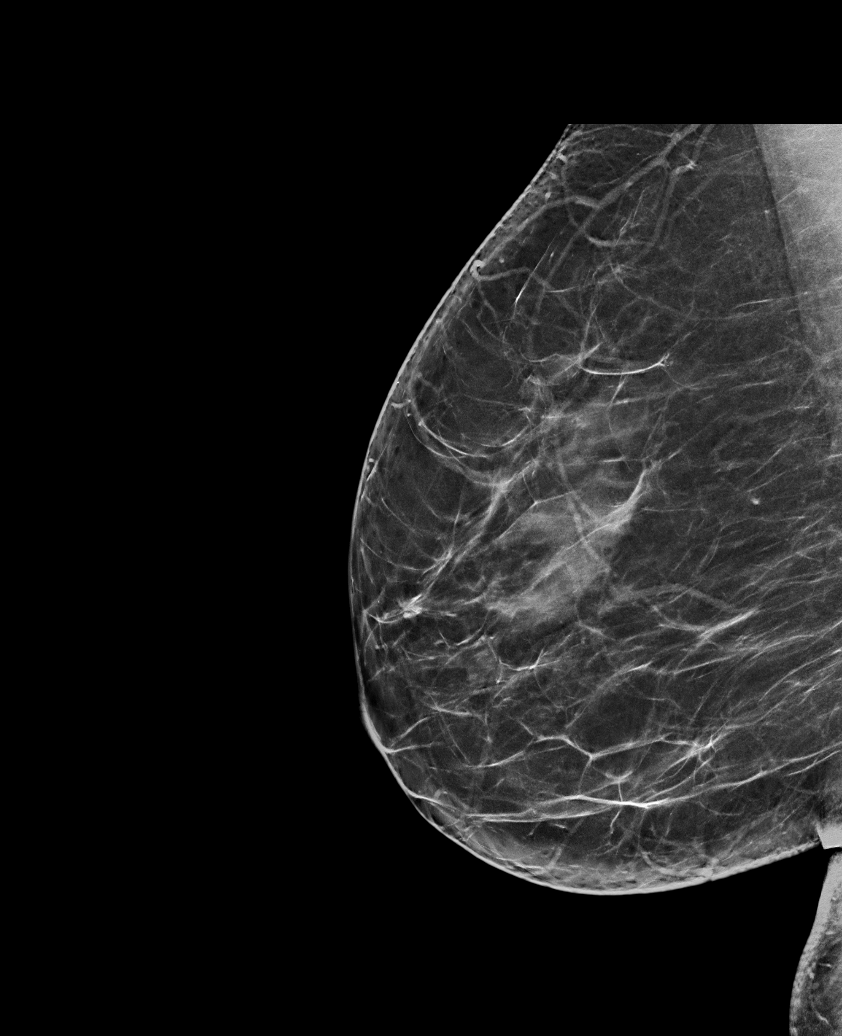

[L MLO synth-2D]
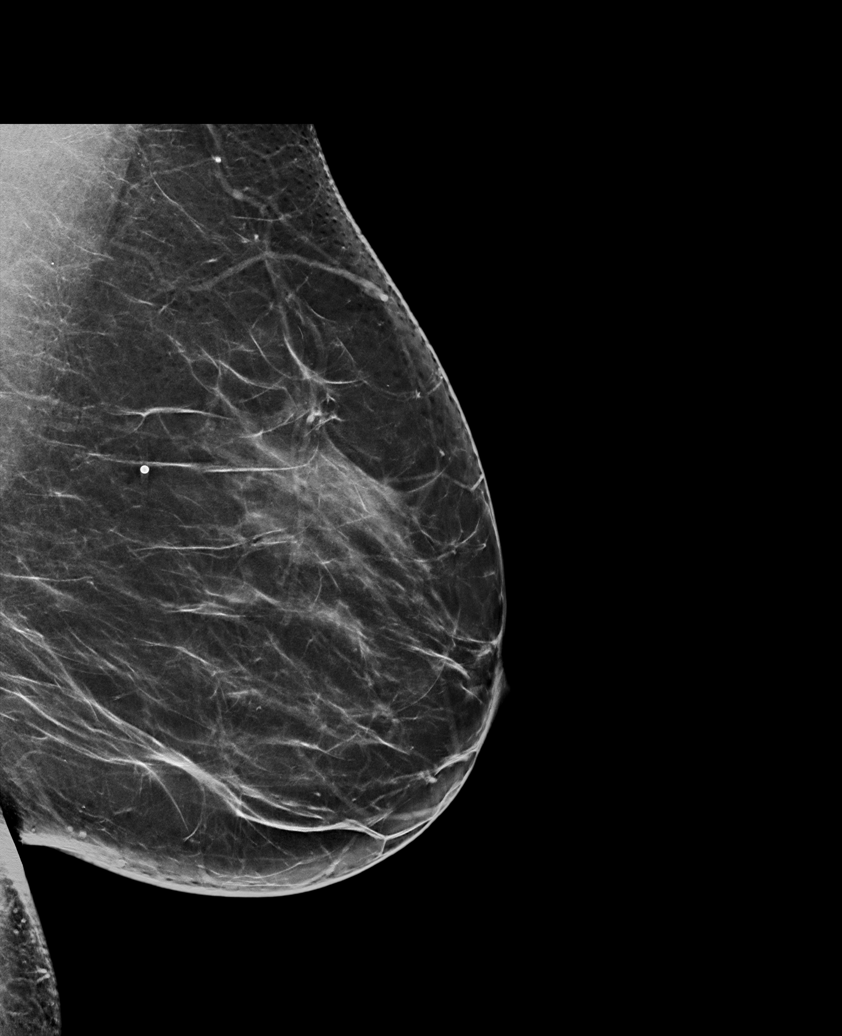

[R CC synth-2D]
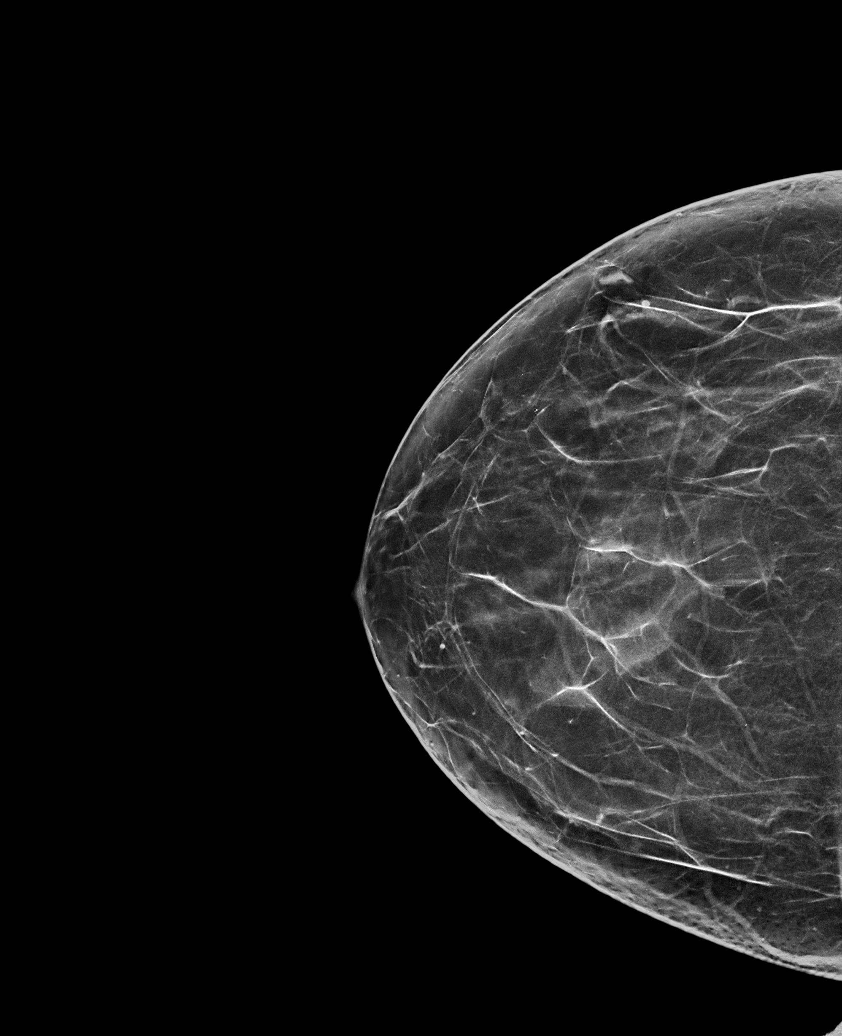

[L CC synth-2D]
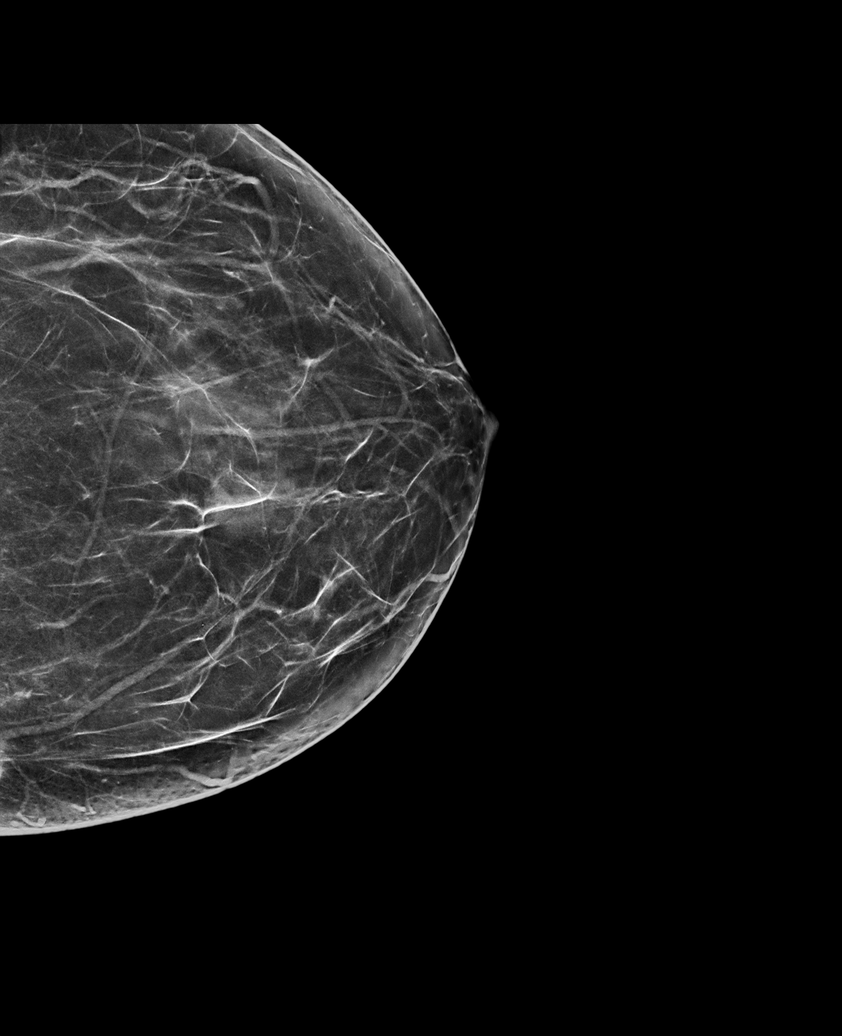

[L MLO tomo · tomo slice 41/81.0]
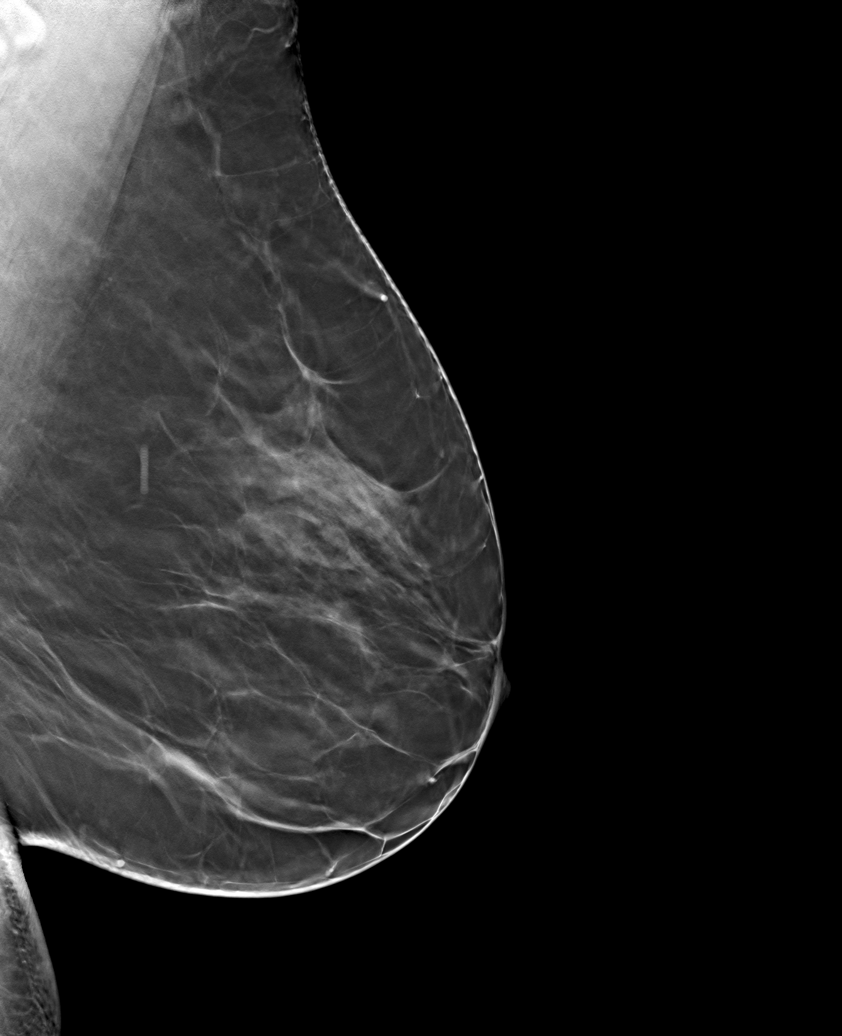

[6 of 25 positions shown; findings below may reference images not displayed]

ACR Breast Density Category b: There are scattered areas of
fibroglandular density.
FINDINGS: No suspicious masses or calcifications are seen in either breast.
Spot compression tangential view over the area of concern in the
left breast was performed with no definite abnormality seen.

Physical examination reveals bandlike pain in the outer left breast
with deep palpation.

Targeted ultrasound of the left breast was performed. No suspicious
masses or abnormality seen, only normal-appearing fibroglandular
tissue identified.
IMPRESSION: . No mammographic or sonographic abnormalities in the region of pain
in the outer left breast.

2.  No mammographic evidence of malignancy in either breast.

RECOMMENDATION:
1. Recommend further management of nonfocal left breast pain be
based clinical assessment.

2.  Screening mammogram in one year.(Code:J6-2-AFU)

I have discussed the findings and recommendations with the patient.
If applicable, a reminder letter will be sent to the patient
regarding the next appointment.

BI-RADS CATEGORY  1: Negative.

## 2023-07-06 ENCOUNTER — Other Ambulatory Visit: Payer: Self-pay | Admitting: Physician Assistant

## 2023-07-06 DIAGNOSIS — Z1231 Encounter for screening mammogram for malignant neoplasm of breast: Secondary | ICD-10-CM

## 2024-03-14 NOTE — [facility] Nurse Program (Signed)
 Follow up completed as to nurse case managemen of [facility] needs

## 2024-05-27 ENCOUNTER — Other Ambulatory Visit: Payer: Self-pay | Admitting: Medical Genetics

## 2024-08-19 ENCOUNTER — Other Ambulatory Visit: Payer: Self-pay | Admitting: Physician Assistant

## 2024-08-19 DIAGNOSIS — N644 Mastodynia: Secondary | ICD-10-CM

## 2024-08-30 ENCOUNTER — Other Ambulatory Visit: Payer: Self-pay | Admitting: Medical Genetics

## 2024-08-30 DIAGNOSIS — Z006 Encounter for examination for normal comparison and control in clinical research program: Secondary | ICD-10-CM

## 2024-09-05 ENCOUNTER — Inpatient Hospital Stay: Admission: RE | Admit: 2024-09-05 | Source: Ambulatory Visit

## 2024-09-05 ENCOUNTER — Other Ambulatory Visit
